# Patient Record
Sex: Male | Born: 1955 | Race: Black or African American | Hispanic: No | Marital: Single | Smoking: Light tobacco smoker
Health system: Southern US, Community
[De-identification: ages and names within clinical notes are randomized; demographics above are authoritative.]

## PROBLEM LIST (undated history)

## (undated) DIAGNOSIS — B1920 Unspecified viral hepatitis C without hepatic coma: Secondary | ICD-10-CM

## (undated) DIAGNOSIS — M109 Gout, unspecified: Secondary | ICD-10-CM

## (undated) DIAGNOSIS — G5600 Carpal tunnel syndrome, unspecified upper limb: Secondary | ICD-10-CM

## (undated) DIAGNOSIS — C801 Malignant (primary) neoplasm, unspecified: Secondary | ICD-10-CM

## (undated) DIAGNOSIS — K8020 Calculus of gallbladder without cholecystitis without obstruction: Secondary | ICD-10-CM

## (undated) DIAGNOSIS — I10 Essential (primary) hypertension: Secondary | ICD-10-CM

## (undated) HISTORY — PX: NO PAST SURGERIES: SHX2092

---

## 2006-05-22 ENCOUNTER — Emergency Department: Payer: Self-pay | Admitting: Internal Medicine

## 2007-08-21 ENCOUNTER — Emergency Department: Payer: Self-pay | Admitting: Emergency Medicine

## 2008-07-20 ENCOUNTER — Emergency Department: Payer: Self-pay | Admitting: Emergency Medicine

## 2008-08-14 ENCOUNTER — Emergency Department: Payer: Self-pay | Admitting: Emergency Medicine

## 2008-10-29 ENCOUNTER — Emergency Department: Payer: Self-pay | Admitting: Emergency Medicine

## 2010-03-19 ENCOUNTER — Ambulatory Visit: Payer: Self-pay

## 2010-07-26 ENCOUNTER — Emergency Department: Payer: Self-pay | Admitting: Unknown Physician Specialty

## 2012-02-17 ENCOUNTER — Emergency Department: Payer: Self-pay

## 2012-02-19 ENCOUNTER — Emergency Department: Payer: Self-pay | Admitting: Emergency Medicine

## 2014-05-02 ENCOUNTER — Emergency Department: Payer: Self-pay | Admitting: Emergency Medicine

## 2014-05-02 LAB — CBC
HCT: 42.3 % (ref 40.0–52.0)
HGB: 13.6 g/dL (ref 13.0–18.0)
MCH: 28.4 pg (ref 26.0–34.0)
MCHC: 32.1 g/dL (ref 32.0–36.0)
MCV: 89 fL (ref 80–100)
PLATELETS: 197 10*3/uL (ref 150–440)
RBC: 4.78 10*6/uL (ref 4.40–5.90)
RDW: 14.5 % (ref 11.5–14.5)
WBC: 7.9 10*3/uL (ref 3.8–10.6)

## 2014-05-02 LAB — URINALYSIS, COMPLETE
Bacteria: NONE SEEN
Bilirubin,UR: NEGATIVE
Blood: NEGATIVE
Glucose,UR: NEGATIVE mg/dL (ref 0–75)
KETONE: NEGATIVE
NITRITE: NEGATIVE
PH: 5 (ref 4.5–8.0)
PROTEIN: NEGATIVE
Specific Gravity: 1.005 (ref 1.003–1.030)
Squamous Epithelial: NONE SEEN

## 2014-05-02 LAB — COMPREHENSIVE METABOLIC PANEL
ALK PHOS: 131 U/L — AB
ANION GAP: 8 (ref 7–16)
AST: 149 U/L — AB (ref 15–37)
Albumin: 3.3 g/dL — ABNORMAL LOW (ref 3.4–5.0)
BILIRUBIN TOTAL: 0.5 mg/dL (ref 0.2–1.0)
BUN: 10 mg/dL (ref 7–18)
CALCIUM: 8.5 mg/dL (ref 8.5–10.1)
CREATININE: 1 mg/dL (ref 0.60–1.30)
Chloride: 106 mmol/L (ref 98–107)
Co2: 24 mmol/L (ref 21–32)
EGFR (Non-African Amer.): >60
Glucose: 99 mg/dL (ref 65–99)
OSMOLALITY: 275 (ref 275–301)
Potassium: 3.7 mmol/L (ref 3.5–5.1)
SGPT (ALT): 132 U/L — ABNORMAL HIGH (ref 12–78)
Sodium: 138 mmol/L (ref 136–145)
Total Protein: 8.6 g/dL — ABNORMAL HIGH (ref 6.4–8.2)

## 2014-05-02 LAB — CK TOTAL AND CKMB (NOT AT ARMC)
CK, Total: 154 U/L
CK-MB: 0.7 ng/mL (ref 0.5–3.6)

## 2014-05-02 LAB — TROPONIN I
Troponin-I: <0.02 ng/mL
Troponin-I: <0.02 ng/mL

## 2014-07-10 ENCOUNTER — Emergency Department: Payer: Self-pay | Admitting: Emergency Medicine

## 2014-07-10 LAB — BASIC METABOLIC PANEL
Anion Gap: 7 (ref 7–16)
BUN: 11 mg/dL (ref 7–18)
Calcium, Total: 8.8 mg/dL (ref 8.5–10.1)
Chloride: 106 mmol/L (ref 98–107)
Co2: 26 mmol/L (ref 21–32)
Creatinine: 1.21 mg/dL (ref 0.60–1.30)
EGFR (African American): >60
GLUCOSE: 111 mg/dL — AB (ref 65–99)
Osmolality: 278 (ref 275–301)
Potassium: 3.4 mmol/L — ABNORMAL LOW (ref 3.5–5.1)
SODIUM: 139 mmol/L (ref 136–145)

## 2014-07-10 LAB — CBC
HCT: 43.1 % (ref 40.0–52.0)
HGB: 13.7 g/dL (ref 13.0–18.0)
MCH: 29.3 pg (ref 26.0–34.0)
MCHC: 31.8 g/dL — AB (ref 32.0–36.0)
MCV: 92 fL (ref 80–100)
Platelet: 196 10*3/uL (ref 150–440)
RBC: 4.68 10*6/uL (ref 4.40–5.90)
RDW: 14.4 % (ref 11.5–14.5)
WBC: 6.2 10*3/uL (ref 3.8–10.6)

## 2014-07-10 LAB — PRO B NATRIURETIC PEPTIDE: B-TYPE NATIURETIC PEPTID: 111 pg/mL (ref 0–125)

## 2014-07-10 LAB — TROPONIN I

## 2014-09-25 ENCOUNTER — Emergency Department: Payer: Self-pay | Admitting: Emergency Medicine

## 2015-03-16 ENCOUNTER — Emergency Department: Payer: Self-pay | Admitting: Emergency Medicine

## 2015-03-16 LAB — BASIC METABOLIC PANEL
ANION GAP: 4 — AB (ref 7–16)
BUN: 14 mg/dL
CALCIUM: 8.8 mg/dL — AB
CO2: 29 mmol/L
Chloride: 105 mmol/L
Creatinine: 1.02 mg/dL
EGFR (African American): >60
GLUCOSE: 109 mg/dL — AB
Potassium: 4.5 mmol/L
Sodium: 138 mmol/L

## 2015-03-16 LAB — CBC WITH DIFFERENTIAL/PLATELET
BASOS ABS: 0 10*3/uL (ref 0.0–0.1)
Basophil %: 0.6 %
Eosinophil #: 0.1 10*3/uL (ref 0.0–0.7)
Eosinophil %: 2.2 %
HCT: 43.1 % (ref 40.0–52.0)
HGB: 13.8 g/dL (ref 13.0–18.0)
LYMPHS ABS: 1.8 10*3/uL (ref 1.0–3.6)
LYMPHS PCT: 40.5 %
MCH: 28.9 pg (ref 26.0–34.0)
MCHC: 32.1 g/dL (ref 32.0–36.0)
MCV: 90 fL (ref 80–100)
MONOS PCT: 17.9 %
Monocyte #: 0.8 x10 3/mm (ref 0.2–1.0)
Neutrophil #: 1.7 10*3/uL (ref 1.4–6.5)
Neutrophil %: 38.8 %
PLATELETS: 172 10*3/uL (ref 150–440)
RBC: 4.79 10*6/uL (ref 4.40–5.90)
RDW: 14.5 % (ref 11.5–14.5)
WBC: 4.4 10*3/uL (ref 3.8–10.6)

## 2015-03-16 LAB — TROPONIN I: Troponin-I: <0.03 ng/mL

## 2015-03-23 ENCOUNTER — Emergency Department
Admit: 2015-03-23 | Disposition: A | Discharge status: Home / Self Care | Payer: Self-pay | Admitting: Emergency Medicine

## 2015-03-23 LAB — CBC
HCT: 43.3 % (ref 40.0–52.0)
HGB: 14.4 g/dL (ref 13.0–18.0)
MCH: 29.6 pg (ref 26.0–34.0)
MCHC: 33.4 g/dL (ref 32.0–36.0)
MCV: 89 fL (ref 80–100)
Platelet: 170 10*3/uL (ref 150–440)
RBC: 4.88 10*6/uL (ref 4.40–5.90)
RDW: 14.2 % (ref 11.5–14.5)
WBC: 7.1 10*3/uL (ref 3.8–10.6)

## 2015-03-23 LAB — TROPONIN I

## 2015-03-23 LAB — BASIC METABOLIC PANEL
Anion Gap: 8 (ref 7–16)
BUN: 15 mg/dL
CREATININE: 1.05 mg/dL
Calcium, Total: 9.1 mg/dL
Chloride: 97 mmol/L — ABNORMAL LOW
Co2: 27 mmol/L
EGFR (African American): >60
EGFR (Non-African Amer.): >60
GLUCOSE: 121 mg/dL — AB
POTASSIUM: 3.3 mmol/L — AB
SODIUM: 132 mmol/L — AB

## 2017-01-10 ENCOUNTER — Emergency Department: Payer: Self-pay

## 2017-01-10 ENCOUNTER — Emergency Department
Admission: EM | Admit: 2017-01-10 | Discharge: 2017-01-10 | Disposition: A | Discharge status: Home / Self Care | Payer: Self-pay | Attending: Student in an Organized Health Care Education/Training Program | Admitting: Student in an Organized Health Care Education/Training Program

## 2017-01-10 ENCOUNTER — Encounter: Payer: Self-pay | Admitting: Emergency Medicine

## 2017-01-10 DIAGNOSIS — M109 Gout, unspecified: Secondary | ICD-10-CM

## 2017-01-10 DIAGNOSIS — F17200 Nicotine dependence, unspecified, uncomplicated: Secondary | ICD-10-CM | POA: Insufficient documentation

## 2017-01-10 DIAGNOSIS — I10 Essential (primary) hypertension: Secondary | ICD-10-CM | POA: Insufficient documentation

## 2017-01-10 DIAGNOSIS — M25532 Pain in left wrist: Secondary | ICD-10-CM

## 2017-01-10 DIAGNOSIS — M1009 Idiopathic gout, multiple sites: Secondary | ICD-10-CM | POA: Insufficient documentation

## 2017-01-10 HISTORY — DX: Essential (primary) hypertension: I10

## 2017-01-10 LAB — BASIC METABOLIC PANEL
ANION GAP: 6 (ref 5–15)
BUN: 16 mg/dL (ref 6–20)
CHLORIDE: 108 mmol/L (ref 101–111)
CO2: 27 mmol/L (ref 22–32)
Calcium: 9 mg/dL (ref 8.9–10.3)
Creatinine, Ser: 0.95 mg/dL (ref 0.61–1.24)
GFR calc non Af Amer: >60 mL/min (ref >60)
Glucose, Bld: 104 mg/dL — ABNORMAL HIGH (ref 65–99)
Potassium: 3.9 mmol/L (ref 3.5–5.1)
Sodium: 141 mmol/L (ref 135–145)

## 2017-01-10 LAB — CBC
HEMATOCRIT: 41.2 % (ref 40.0–52.0)
HEMOGLOBIN: 13.5 g/dL (ref 13.0–18.0)
MCH: 29.5 pg (ref 26.0–34.0)
MCHC: 32.8 g/dL (ref 32.0–36.0)
MCV: 89.9 fL (ref 80.0–100.0)
Platelets: 171 10*3/uL (ref 150–440)
RBC: 4.59 MIL/uL (ref 4.40–5.90)
RDW: 12.8 % (ref 11.5–14.5)
WBC: 5.9 10*3/uL (ref 3.8–10.6)

## 2017-01-10 LAB — TROPONIN I: Troponin I: <0.03 ng/mL (ref <0.03)

## 2017-01-10 LAB — URIC ACID: Uric Acid, Serum: 11.1 mg/dL — ABNORMAL HIGH (ref 4.4–7.6)

## 2017-01-10 MED ORDER — OXYCODONE-ACETAMINOPHEN 5-325 MG PO TABS
2.0000 | ORAL_TABLET | Freq: Once | ORAL | Status: AC
  Administered 2017-01-10: 2 via ORAL
  Filled 2017-01-10: qty 2

## 2017-01-10 MED ORDER — NAPROXEN 500 MG PO TABS: 500.0000 mg | ORAL_TABLET | Freq: Two times a day (BID) | ORAL | 0 refills | Status: DC

## 2017-01-10 MED ORDER — HYDROCODONE-ACETAMINOPHEN 5-325 MG PO TABS: 1.0000 | ORAL_TABLET | ORAL | 0 refills | Status: DC | PRN

## 2017-01-10 MED ORDER — PREDNISONE 20 MG PO TABS
60.0000 mg | ORAL_TABLET | Freq: Once | ORAL | Status: AC
  Administered 2017-01-10: 60 mg via ORAL
  Filled 2017-01-10: qty 3

## 2017-01-10 MED ORDER — NAPROXEN 375 MG PO TABS
375.0000 mg | ORAL_TABLET | Freq: Two times a day (BID) | ORAL | 0 refills | Status: AC
Start: 2017-01-10 — End: 2017-01-20

## 2017-01-10 NOTE — ED Triage Notes (Addendum)
Pt c/o pain to left chest, arm, and wrist on and off for week. Denies injury. Denies any other symptoms than pain. Not worse with exertion. Pt shows no acute distress. Also c/o numbness at times in left arm

## 2017-01-10 NOTE — Discharge Instructions (Signed)
Return to ER for any increase in pain, if the pain changes or becomes worse with physical activity, you have shortness of breath, nausea or vomiting associated with the chest pain. ° °

## 2017-01-10 NOTE — ED Provider Notes (Signed)
[facility] Emergency Department Provider Note    First MD Initiated Contact with Patient 01/10/17 [phone]     (approximate)  I have reviewed the triage vital signs and the nursing notes.   HISTORY  Chief Complaint Chest Pain    HPI Tim Potter is a 61 y.o. male with a history of hypertension as well as gout presents with intermittent left wrist pain for the past 10 days. States that the pain started in his left wrist with radiation up his arm and to his left shoulder. Patient is having some chest discomfort secondary to left wrist pain. States that when he has alcohol to drink or red meats the night before that awakened the next morning with worsening of his wrist pain. States it feels similar to his gout pain from his toe but he does not recall having gout in his left wrist. Denies any trauma. No shortness of breath. No diaphoresis or dizziness. No nausea or vomiting.   Past Medical History:  Diagnosis Date  . Hypertension    History reviewed. No pertinent family history. History reviewed. No pertinent surgical history. There are no active problems to display for this patient.     Prior to Admission medications   Medication Sig Start Date End Date Taking? Authorizing Provider  HYDROcodone-acetaminophen (NORCO) 5-325 MG tablet Take 1 tablet by mouth every 4 (four) hours as needed for moderate pain. 01/10/17   Merlyn Lot, MD  naproxen (NAPROSYN) 375 MG tablet Take 1 tablet (375 mg total) by mouth 2 (two) times daily with a meal. 01/10/17 01/20/17  Merlyn Lot, MD  naproxen (NAPROSYN) 500 MG tablet Take 1 tablet (500 mg total) by mouth 2 (two) times daily with a meal. 01/10/17 01/10/18  Merlyn Lot, MD    Allergies Patient has no known allergies.    Social History Social History  Substance Use Topics  . Smoking status: Current Every Day Smoker  . Smokeless tobacco: Never Used  . Alcohol use Yes    Review of Systems Patient  denies headaches, rhinorrhea, blurry vision, numbness, shortness of breath, chest pain, edema, cough, abdominal pain, nausea, vomiting, diarrhea, dysuria, fevers, rashes or hallucinations unless otherwise stated above in HPI. ____________________________________________   PHYSICAL EXAM:  VITAL SIGNS: Vitals:   01/10/17 0800  BP: (!) 160/107  Pulse: 72  Resp: 17  Temp: 97.8 F (36.6 C)    Constitutional: Alert and oriented. Well appearing and in no acute distress. Eyes: Conjunctivae are normal. PERRL. EOMI. Head: Atraumatic. Nose: No congestion/rhinnorhea. Mouth/Throat: Mucous membranes are moist.  Oropharynx non-erythematous. Neck: No stridor. Painless ROM. No cervical spine tenderness to palpation Hematological/Lymphatic/Immunilogical: No cervical lymphadenopathy. Cardiovascular: Normal rate, regular rhythm. Grossly normal heart sounds.  Good peripheral circulation. Respiratory: Normal respiratory effort.  No retractions. Lungs CTAB. Gastrointestinal: Soft and nontender. No distention. No abdominal bruits. No CVA tenderness. Musculoskeletal: No lower extremity tenderness nor edema.  No joint effusions.  Left wrist with pian with active and passive rom beyon 20 degrees.  No overlying warmth or erythema.  No deformity.   Neurologic:  Normal speech and language. No gross focal neurologic deficits are appreciated. No gait instability. Skin:  Skin is warm, dry and intact. No rash noted. Psychiatric: Mood and affect are normal. Speech and behavior are normal.  ____________________________________________   LABS (all labs ordered are listed, but only abnormal results are displayed)  Results for orders placed or performed during the hospital encounter of 01/10/17 (from the past 24 hour(s))  Basic  metabolic panel     Status: Abnormal   Collection Time: 01/10/17  8:21 AM  Result Value Ref Range   Sodium 141 135 - 145 mmol/L   Potassium 3.9 3.5 - 5.1 mmol/L   Chloride 108 101 - 111  mmol/L   CO2 27 22 - 32 mmol/L   Glucose, Bld 104 (H) 65 - 99 mg/dL   BUN 16 6 - 20 mg/dL   Creatinine, Ser 0.95 0.61 - 1.24 mg/dL   Calcium 9.0 8.9 - 10.3 mg/dL   GFR calc non Af Amer >60 >60 mL/min   GFR calc Af Amer >60 >60 mL/min   Anion gap 6 5 - 15  CBC     Status: None   Collection Time: 01/10/17  8:21 AM  Result Value Ref Range   WBC 5.9 3.8 - 10.6 K/uL   RBC 4.59 4.40 - 5.90 MIL/uL   Hemoglobin 13.5 13.0 - 18.0 g/dL   HCT 41.2 40.0 - 52.0 %   MCV 89.9 80.0 - 100.0 fL   MCH 29.5 26.0 - 34.0 pg   MCHC 32.8 32.0 - 36.0 g/dL   RDW 12.8 11.5 - 14.5 %   Platelets 171 150 - 440 K/uL  Troponin I     Status: None   Collection Time: 01/10/17  8:21 AM  Result Value Ref Range   Troponin I <0.03 <0.03 ng/mL  Uric acid     Status: Abnormal   Collection Time: 01/10/17  9:43 AM  Result Value Ref Range   Uric Acid, Serum 11.1 (H) 4.4 - 7.6 mg/dL   ____________________________________________  EKG My review and personal interpretation at Time: 8:13   Indication: chest pain  Rate: 70  Rhythm: sinus Axis: normal Other: no STEMI, normal intervals,  ____________________________________________  RADIOLOGY  I personally reviewed all radiographic images ordered to evaluate for the above acute complaints and reviewed radiology reports and findings.  These findings were personally discussed with the patient.  Please see medical record for radiology report. ____________________________________________   PROCEDURES  Procedure(s) performed:  Procedures    Critical Care performed: no ____________________________________________   INITIAL IMPRESSION / ASSESSMENT AND PLAN / ED COURSE  Pertinent labs & imaging results that were available during my care of the patient were reviewed by me and considered in my medical decision making (see chart for details).  DDX: gout, septic arthritis, ra, oa, acs,   Tim Potter is a 61 y.o. who presents to the ED with left wrist pain over the  past week. Symptoms worsened by alcohol and red meat. Patient arrives in no acute distress. No neuro deficit. Well-perfused and equal pulses bilaterally. Not consistent with dissection. Troponin is negative and EKG shows no ischemic changes. Based on duration symptoms do not feel this is consistent with ACS more likely referred pain from acute arthritis to left wrist. Patient without any fever or overlying warmth or erythema. Feel that this is consistent with septic arthritis. We'll check uric acid, radiographs and provide pain medication.  Clinical Course as of Jan 10 1039  Sun Jan 10, 2017  1026 Uric acid markedly elevated. Presentation most consistent with guty arthritis.  Will provide steroids and NSAIDs.  Discussed signs and symptoms for which patient should return to the hospital.  Patient was able to tolerate PO and was able to ambulate with a steady gait.  Have discussed with the patient and available family all diagnostics and treatments performed thus far and all questions were answered to the best of  my ability. The patient demonstrates understanding and agreement with plan.   [PR]    Clinical Course User Index [PR] Merlyn Lot, MD     ____________________________________________   FINAL CLINICAL IMPRESSION(S) / ED DIAGNOSES  Final diagnoses:  Wrist pain, acute, left  Acute gout of multiple sites, unspecified cause      NEW MEDICATIONS STARTED DURING THIS VISIT:  New Prescriptions   HYDROCODONE-ACETAMINOPHEN (NORCO) 5-325 MG TABLET    Take 1 tablet by mouth every 4 (four) hours as needed for moderate pain.   NAPROXEN (NAPROSYN) 375 MG TABLET    Take 1 tablet (375 mg total) by mouth 2 (two) times daily with a meal.   NAPROXEN (NAPROSYN) 500 MG TABLET    Take 1 tablet (500 mg total) by mouth 2 (two) times daily with a meal.     Note:  This document was prepared using Dragon voice recognition software and may include unintentional dictation errors.    Merlyn Lot, MD 01/10/17 1040

## 2017-01-10 NOTE — ED Notes (Signed)
Pt c/o poss gout to left wrist, states swelling and painful x1wk. Denies fever

## 2017-05-30 ENCOUNTER — Emergency Department
Admission: EM | Admit: 2017-05-30 | Discharge: 2017-05-30 | Disposition: A | Discharge status: Home / Self Care | Payer: Self-pay | Attending: Emergency Medicine | Admitting: Emergency Medicine

## 2017-05-30 ENCOUNTER — Emergency Department: Payer: Self-pay

## 2017-05-30 ENCOUNTER — Encounter: Payer: Self-pay | Admitting: Emergency Medicine

## 2017-05-30 DIAGNOSIS — I10 Essential (primary) hypertension: Secondary | ICD-10-CM | POA: Insufficient documentation

## 2017-05-30 DIAGNOSIS — I8002 Phlebitis and thrombophlebitis of superficial vessels of left lower extremity: Secondary | ICD-10-CM | POA: Insufficient documentation

## 2017-05-30 DIAGNOSIS — F17200 Nicotine dependence, unspecified, uncomplicated: Secondary | ICD-10-CM | POA: Insufficient documentation

## 2017-05-30 NOTE — ED Notes (Signed)

## 2017-05-30 NOTE — ED Provider Notes (Signed)
[facility] Emergency Department Provider Note   ____________________________________________   I have reviewed the triage vital signs and the nursing notes.   HISTORY  Chief Complaint Leg Pain    HPI Tim Potter is a 61 y.o. male presents with left calf pain that began 2-3 weeks ago. Patient notes a hard palpable nodule along the medial calf approximately 3-4 mm in diameter. Patient denies left leg swelling, shortness of breath, recent period of sedentary activity, surgical procedure, recent flights, history of cancer or other risk factors for clotting.  Patient denies fever, chills, headache, vision changes, chest pain, chest tightness, shortness of breath, abdominal pain, nausea and vomiting.  Past Medical History:  Diagnosis Date  . Hypertension     There are no active problems to display for this patient.   History reviewed. No pertinent surgical history.  Prior to Admission medications   Medication Sig Start Date End Date Taking? Authorizing Provider  HYDROcodone-acetaminophen (NORCO) 5-325 MG tablet Take 1 tablet by mouth every 4 (four) hours as needed for moderate pain. 01/10/17   Merlyn Lot, MD  naproxen (NAPROSYN) 500 MG tablet Take 1 tablet (500 mg total) by mouth 2 (two) times daily with a meal. 01/10/17 01/10/18  Merlyn Lot, MD    Allergies Patient has no known allergies.  History reviewed. No pertinent family history.  Social History Social History  Substance Use Topics  . Smoking status: Current Every Day Smoker  . Smokeless tobacco: Never Used  . Alcohol use Yes    Review of Systems Constitutional: Negative for fever/chills Eyes: No visual changes. ENT:  Negative for sore throat and for difficulty swallowing Cardiovascular: Denies chest pain. Respiratory: Denies cough Denies shortness of breath. Musculoskeletal: Left calf pain, painful to touch. Skin: Negative for rash. Neurological: Negative for  headaches.  Negative focal weakness or numbness. Negative for loss of consciousness. Able to ambulate. ____________________________________________   PHYSICAL EXAM:  VITAL SIGNS: ED Triage Vitals [05/30/17 1123]  Enc Vitals Group     BP (!) 172/86     Pulse Rate 100     Resp 16     Temp 98.3 F (36.8 C)     Temp Source Oral     SpO2 96 %     Weight 180 lb (81.6 kg)     Height 5\' 10"  (1.778 m)     Head Circumference      Peak Flow      Pain Score 6     Pain Loc      Pain Edu?      Excl. in [place]?     Constitutional: Alert and oriented. Well appearing and in no acute distress.  Head: Normocephalic and atraumatic. Eyes: Conjunctivae are normal. PERRL.  Cardiovascular: Normal rate, regular rhythm. Normal distal pulses. Respiratory: Normal respiratory effort. No wheezes/rales/rhonchi. Lungs CTAB Musculoskeletal: Nontender with normal range of motion in all extremities. Left medial calf tender to palpation along firm papule consistent with US findings of superficial thrombophlebitis. Left lower extremity strength and sensation intact.  Neurologic: Normal speech and language.  Skin:  Skin is warm, dry and intact. No rash noted. Psychiatric: Mood and affect are normal.  ____________________________________________   LABS (all labs ordered are listed, but only abnormal results are displayed)  Labs Reviewed - No data to display ____________________________________________  EKG None ____________________________________________  RADIOLOGY Ultrasound venous lower leg left IMPRESSION: No evidence of DVT within the left lower extremity.  Small thrombus in a small superficial vein in  the lower leg is compatible with superficial thrombophlebitis. ____________________________________________   PROCEDURES  Procedure(s) performed: No    Critical Care performed: no ____________________________________________   INITIAL IMPRESSION / ASSESSMENT AND PLAN / ED  COURSE  Pertinent labs & imaging results that were available during my care of the patient were reviewed by me and considered in my medical decision making (see chart for details).  Patient presents with left medial lower leg pain that began 2-3 weeks ago without traumatic injury. Physical exam, patient history and ultrasound venous of the left leg are reassuring of no DVT. Symptoms consistent with superficial vein thrombophlebitis along the area of tenderness and firm nodule. Recommended patient continue to monitor, take NSAIDs as needed, compression hose, and warm compresses. Patient  informed of clinical course, understand medical decision-making process, and agree with plan. Patient was advised to follow up with PCP as needed and was also advised to return to the emergency department for symptoms that change or worsen.      _____________________   FINAL CLINICAL IMPRESSION(S) / ED DIAGNOSES  Final diagnoses:  Thrombophlebitis of superficial veins of left lower extremity       NEW MEDICATIONS STARTED DURING THIS VISIT:  Discharge Medication List as of 05/30/2017  3:32 PM       Note:  This document was prepared using Dragon voice recognition software and may include unintentional dictation errors.    Arah Aro, Laroy Apple, PA-C 05/30/17 1808    Darel Hong, MD 05/31/17 [phone]

## 2017-05-30 NOTE — ED Triage Notes (Signed)
Pt c/o pain to left calf for 2-3 weeks.  No swelling. Small hard palpable knot to left calf.  Painful to touch.

## 2017-07-22 ENCOUNTER — Emergency Department: Payer: Self-pay

## 2017-07-22 ENCOUNTER — Emergency Department
Admission: EM | Admit: 2017-07-22 | Discharge: 2017-07-22 | Disposition: A | Discharge status: Home / Self Care | Payer: Self-pay | Attending: Emergency Medicine | Admitting: Emergency Medicine

## 2017-07-22 ENCOUNTER — Encounter: Payer: Self-pay | Admitting: Emergency Medicine

## 2017-07-22 DIAGNOSIS — K769 Liver disease, unspecified: Secondary | ICD-10-CM | POA: Insufficient documentation

## 2017-07-22 DIAGNOSIS — K8020 Calculus of gallbladder without cholecystitis without obstruction: Secondary | ICD-10-CM | POA: Insufficient documentation

## 2017-07-22 DIAGNOSIS — R1011 Right upper quadrant pain: Secondary | ICD-10-CM | POA: Insufficient documentation

## 2017-07-22 DIAGNOSIS — F17200 Nicotine dependence, unspecified, uncomplicated: Secondary | ICD-10-CM | POA: Insufficient documentation

## 2017-07-22 DIAGNOSIS — I10 Essential (primary) hypertension: Secondary | ICD-10-CM | POA: Insufficient documentation

## 2017-07-22 DIAGNOSIS — Z7982 Long term (current) use of aspirin: Secondary | ICD-10-CM | POA: Insufficient documentation

## 2017-07-22 LAB — CBC
HEMATOCRIT: 42.7 % (ref 40.0–52.0)
HEMOGLOBIN: 14.6 g/dL (ref 13.0–18.0)
MCH: 30.4 pg (ref 26.0–34.0)
MCHC: 34.2 g/dL (ref 32.0–36.0)
MCV: 89 fL (ref 80.0–100.0)
Platelets: 136 10*3/uL — ABNORMAL LOW (ref 150–440)
RBC: 4.8 MIL/uL (ref 4.40–5.90)
RDW: 14.1 % (ref 11.5–14.5)
WBC: 7 10*3/uL (ref 3.8–10.6)

## 2017-07-22 LAB — COMPREHENSIVE METABOLIC PANEL
ALK PHOS: 134 U/L — AB (ref 38–126)
ALT: 107 U/L — AB (ref 17–63)
ANION GAP: 9 (ref 5–15)
AST: 161 U/L — ABNORMAL HIGH (ref 15–41)
Albumin: 3.8 g/dL (ref 3.5–5.0)
BILIRUBIN TOTAL: 1.7 mg/dL — AB (ref 0.3–1.2)
BUN: 12 mg/dL (ref 6–20)
CO2: 24 mmol/L (ref 22–32)
CREATININE: 1.01 mg/dL (ref 0.61–1.24)
Calcium: 9.1 mg/dL (ref 8.9–10.3)
Chloride: 104 mmol/L (ref 101–111)
GFR calc non Af Amer: >60 mL/min (ref >60)
Glucose, Bld: 124 mg/dL — ABNORMAL HIGH (ref 65–99)
Potassium: 3.5 mmol/L (ref 3.5–5.1)
SODIUM: 137 mmol/L (ref 135–145)
TOTAL PROTEIN: 8.6 g/dL — AB (ref 6.5–8.1)

## 2017-07-22 LAB — TROPONIN I

## 2017-07-22 LAB — LIPASE, BLOOD: LIPASE: 32 U/L (ref 11–51)

## 2017-07-22 MED ORDER — HYDROCHLOROTHIAZIDE 12.5 MG PO TABS: 12.5000 mg | ORAL_TABLET | Freq: Every day | ORAL | 1 refills | Status: DC

## 2017-07-22 NOTE — ED Provider Notes (Signed)
[facility] Emergency Department Provider Note  ____________________________________________  Time seen: Approximately 7:45 AM  I have reviewed the triage vital signs and the nursing notes.   HISTORY  Chief Complaint Abdominal Pain; Back Pain; and Chest Pain   HPI Tim Potter is a 61 y.o. male with a history of hypertensionwho presents for evaluation of 2 complaints, chest pain and abdominal pain. Patient reports that these symptoms have been going on for 3 days. They're separate from each other and intermittent. The chest pain describes as a dull-like sensation located in the center of his chest. He has had 1 episode a day for the last 3 days lasting about 30 minutes. Patient unable to remember if he  Ever had pain at rest. He reports that he has to carry heavy things at work and that usually when the pain develops. No diaphoresis, nausea, shortness of breath, palpitations, dizziness. He is a smoker. He has been out of his antihypertensives for several months. He does smoke. No family history of ischemic heart disease. Last episode of pain was yesterday. No pain at this time. He is also complaining of abdominal pain that is located in the center of his abdomen, dull, intermittent, mild to moderate, few episodes daily lasting 30 min at a time. Pain has been on and off for 3 days. Again no pain at this time. Pain not exacerbated by food. No nausea or vomiting, no dysuria hematuria, no constipation or diarrhea, no fever or chills. Patient reports that he came in today just to make sure everything is okay. He drinks one 40 oz beer 4 times a week. CP and abdominal pain happen at separate times.  Past Medical History:  Diagnosis Date  . Hypertension     There are no active problems to display for this patient.   History reviewed. No pertinent surgical history.  Prior to Admission medications   Medication Sig Start Date End Date Taking? Authorizing Provider    aspirin EC 81 MG tablet Take 81 mg by mouth daily.   Yes [provider]  HYDROcodone-acetaminophen (NORCO) 5-325 MG tablet Take 1 tablet by mouth every 4 (four) hours as needed for moderate pain. Patient not taking: Reported on 07/22/2017 01/10/17   Merlyn Lot, MD  naproxen (NAPROSYN) 500 MG tablet Take 1 tablet (500 mg total) by mouth 2 (two) times daily with a meal. Patient not taking: Reported on 07/22/2017 01/10/17 01/10/18  Merlyn Lot, MD    Allergies Patient has no known allergies.  No family history on file.  Social History Social History  Substance Use Topics  . Smoking status: Current Every Day Smoker  . Smokeless tobacco: Never Used  . Alcohol use Yes    Review of Systems  Constitutional: Negative for fever. Eyes: Negative for visual changes. ENT: Negative for sore throat. Neck: No neck pain  Cardiovascular: + chest pain. Respiratory: No shortness of breath. Gastrointestinal: + abdominal pain. No vomiting or diarrhea. Genitourinary: Negative for dysuria. Musculoskeletal: Negative for back pain. Skin: Negative for rash. Neurological: Negative for headaches, weakness or numbness. Psych: No SI or HI  ____________________________________________   PHYSICAL EXAM:  VITAL SIGNS: ED Triage Vitals [07/22/17 0655]  Enc Vitals Group     BP (!) 170/100     Pulse Rate 79     Resp 18     Temp 98 F (36.7 C)     Temp Source Oral     SpO2 99 %     Weight 175  lb (79.4 kg)     Height 5\' 10"  (1.778 m)     Head Circumference      Peak Flow      Pain Score 6     Pain Loc      Pain Edu?      Excl. in [place]?     Constitutional: Alert and oriented. Well appearing and in no apparent distress. HEENT:      Head: Normocephalic and atraumatic.         Eyes: Conjunctivae are normal. Sclera is non-icteric.       Mouth/Throat: Mucous membranes are moist.       Neck: Supple with no signs of meningismus. Cardiovascular: Regular rate and rhythm. No murmurs,  gallops, or rubs. 2+ symmetrical distal pulses are present in all extremities. No JVD. Respiratory: Normal respiratory effort. Lungs are clear to auscultation bilaterally. No wheezes, crackles, or rhonchi.  Gastrointestinal: Soft, Tenderness to palpation in the right upper quadrant with negative Murphy sign, and non distended with positive bowel sounds. No rebound or guarding. Musculoskeletal: Nontender with normal range of motion in all extremities. No edema, cyanosis, or erythema of extremities. Neurologic: Normal speech and language. Face is symmetric. Moving all extremities. No gross focal neurologic deficits are appreciated. Skin: Skin is warm, dry and intact. No rash noted. Psychiatric: Mood and affect are normal. Speech and behavior are normal.  ____________________________________________   LABS (all labs ordered are listed, but only abnormal results are displayed)  Labs Reviewed  COMPREHENSIVE METABOLIC PANEL - Abnormal; Notable for the following:       Result Value   Glucose, Bld 124 (*)    Total Protein 8.6 (*)    AST 161 (*)    ALT 107 (*)    Alkaline Phosphatase 134 (*)    Total Bilirubin 1.7 (*)    All other components within normal limits  CBC - Abnormal; Notable for the following:    Platelets 136 (*)    All other components within normal limits  LIPASE, BLOOD  TROPONIN I   ____________________________________________  EKG  ED ECG REPORT I, Rudene Re, the attending physician, personally viewed and interpreted this ECG.  Normal sinus rhythm, rate of 81, normal intervals, normal axis, no ST elevations or depressions. Normal EKG.  ____________________________________________  RADIOLOGY  CXR: There is no acute cardiopulmonary abnormality. 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  RUQ [place]; 1. Multiple gallstones. No evidence of cholecystitis or biliary distention.  2. Heterogeneous parenchymal pattern consistent with fatty infiltration  and/or hepatocellular disease. 2.7 cm maximum diameter focal area of decreased attenuation noted in the right hepatic lobe. Although this may represent focal fatty sparing, focal significant hepatic lesion cannot be excluded. Gadolinium-enhanced MRI can be obtained to further evaluate. ____________________________________________   PROCEDURES  Procedure(s) performed: None Procedures Critical Care performed:  None ____________________________________________   INITIAL IMPRESSION / ASSESSMENT AND PLAN / ED COURSE   61 y.o. male with a history of hypertensionwho presents for evaluation of 2 complaints, chest pain and abdominal pain.   #CP: episodes while lifting objects. No pain today. Last episode > 6 hours PTA. Chest pain in a 61 y.o. male with low suspicion for cardiac (HEART score 3) or other serious etiology (including aortic dissection, pneumonia, pneumothorax, or pulmonary embolism) based his history and physical exam in the ED today. EKG normal. Plan for labs including CBC, chemistries and troponin x 1 since last episode of pain > 12 hours, CXR and re-evaluation for disposition. Will give  full dose ASA . Will observe patient on cardiac monitor while in the ED.  # abdominal pain: Patient denies any pain however with palpation of his abdomen he has tenderness in the right upper quadrant with negative Murphy sign. Since patient has no pain at this time will check CBC, CMP and lipase and if labs are within normal limits and patient remains pain-free we'll discharge him home with follow-up with primary care doctor for possible gallbladder disease. No RLQ ttp however I did discuss with patient that if pain migrates to the right lower quadrant he needs immediate evaluation for possible appendicitis.  _________________________ 10:15 AM on 07/22/2017 -----------------------------------------  Right upper quadrant ultrasound and labs concerning for cholelithiasis without evidence of  cholecystitis. Patient remains pain-free. Ultrasound concerning for changes in the liver parenchyma. Discussed alcohol use with patient. Recommended follow-up for an outpatient MRI to rule out malignancy. Patient given oncology follow-up. Also given resources to apply for charity care. Patient was restarted on his blood pressure medication. Recommended follow-up with the surgery for evaluation of cholelithiasis. Recommended return to the emergency room if the pain returns and remains constant, he has a fever. Patient will return.    Pertinent labs & imaging results that were available during my care of the patient were reviewed by me and considered in my medical decision making (see chart for details).    ____________________________________________   FINAL CLINICAL IMPRESSION(S) / ED DIAGNOSES  Final diagnoses:  RUQ abdominal pain  Calculus of gallbladder without cholecystitis without obstruction  Liver disease      NEW MEDICATIONS STARTED DURING THIS VISIT:  New Prescriptions   No medications on file     Note:  This document was prepared using Dragon voice recognition software and may include unintentional dictation errors.    Alfred Levins, [place], MD 07/22/17 1031

## 2017-07-22 NOTE — ED Triage Notes (Signed)
Patient ambulatory to triage with steady gait, without difficulty or distress noted; pt reports lower back pain, lower abd pain, mid CP radiating into back last several days; denies hx of same; denies any accomp symptoms

## 2017-07-22 NOTE — ED Notes (Signed)
Patient transported to US at this time.  

## 2017-07-22 NOTE — Discharge Instructions (Signed)
As I explained to you you have gallstones in her gallbladder. I am referring you to Dr. Hampton Abbot for further evaluation. If you're abdominal pain returns and remains persistent for over an hour or if you have fevers please come back to the emergency room as he may need an emergency surgery to remove her gallbladder.  Also has explained to you your liver looks abnormal ultrasound which could be due to drinking or fatty liver disease. However radiology recommended a day you follow-up for an outpatient MRI to rule out liver cancer. I am referring her to Dr. Janese Banks for close follow up. Oncology clinic will see you for a first visit without insurance. It is very important that you follow up otherwise if you do have cancer in your liver, it can spread to other organs and it may be too late for any treatment.  You have also been provided with information on how to apply for charity care [facility] which can cover your medical expenses. Make sure they start working on that as soon as possible.

## 2017-07-22 NOTE — Care Management Note (Signed)
Case Management Note  Patient Details  Name: Tim Potter MRN: [id number] Date of Birth: [birth year]  Subjective/Objective:     Was able to print application form for [facility] after pt. Left. I have put the application in the mail for the patient at the listed address.               Action/Plan:   Expected Discharge Date:                  Expected Discharge Plan:     In-House Referral:     Discharge planning Services     Post Acute Care Choice:    Choice offered to:     DME Arranged:    DME Agency:     HH Arranged:    HH Agency:     Status of Service:     If discussed at [employer] of Stay Meetings, dates discussed:    Additional Comments:  Beau Fanny, RN 07/22/2017, 1:42 PM

## 2017-07-28 ENCOUNTER — Telehealth: Payer: Self-pay | Admitting: Surgery

## 2017-07-28 NOTE — Telephone Encounter (Signed)
I have called patient to make an appointment for ED Follow-up (8/2): Cholelithiasis. No answer. I have left a message on voicemail. Patient can see any available surgeon.

## 2017-08-04 NOTE — Telephone Encounter (Signed)
I have contacted patient to make an ED follow up appointment. Patient states that he does not have insurance at this time and can not make a co-pay of 50.00. Patient states that he will call back and make an appointment at another time when he is financially able.

## 2017-11-07 ENCOUNTER — Emergency Department: Payer: Medicaid Other

## 2017-11-07 ENCOUNTER — Encounter: Payer: Self-pay | Admitting: Emergency Medicine

## 2017-11-07 ENCOUNTER — Inpatient Hospital Stay
Admission: EM | Admit: 2017-11-07 | Discharge: 2017-11-09 | DRG: 437 | Disposition: A | Discharge status: Home / Self Care | Payer: Medicaid Other | Attending: Internal Medicine | Admitting: Internal Medicine

## 2017-11-07 ENCOUNTER — Other Ambulatory Visit: Payer: Self-pay

## 2017-11-07 DIAGNOSIS — Z791 Long term (current) use of non-steroidal anti-inflammatories (NSAID): Secondary | ICD-10-CM | POA: Diagnosis not present

## 2017-11-07 DIAGNOSIS — K30 Functional dyspepsia: Secondary | ICD-10-CM | POA: Diagnosis present

## 2017-11-07 DIAGNOSIS — I10 Essential (primary) hypertension: Secondary | ICD-10-CM | POA: Diagnosis present

## 2017-11-07 DIAGNOSIS — R160 Hepatomegaly, not elsewhere classified: Secondary | ICD-10-CM | POA: Diagnosis present

## 2017-11-07 DIAGNOSIS — Z79899 Other long term (current) drug therapy: Secondary | ICD-10-CM | POA: Diagnosis not present

## 2017-11-07 DIAGNOSIS — R198 Other specified symptoms and signs involving the digestive system and abdomen: Secondary | ICD-10-CM

## 2017-11-07 DIAGNOSIS — C220 Liver cell carcinoma: Principal | ICD-10-CM | POA: Diagnosis present

## 2017-11-07 DIAGNOSIS — I81 Portal vein thrombosis: Secondary | ICD-10-CM | POA: Diagnosis present

## 2017-11-07 DIAGNOSIS — Z823 Family history of stroke: Secondary | ICD-10-CM | POA: Diagnosis not present

## 2017-11-07 DIAGNOSIS — M109 Gout, unspecified: Secondary | ICD-10-CM | POA: Diagnosis present

## 2017-11-07 DIAGNOSIS — F17200 Nicotine dependence, unspecified, uncomplicated: Secondary | ICD-10-CM | POA: Diagnosis present

## 2017-11-07 DIAGNOSIS — Z7982 Long term (current) use of aspirin: Secondary | ICD-10-CM

## 2017-11-07 DIAGNOSIS — K769 Liver disease, unspecified: Secondary | ICD-10-CM

## 2017-11-07 DIAGNOSIS — R1011 Right upper quadrant pain: Secondary | ICD-10-CM

## 2017-11-07 HISTORY — DX: Carpal tunnel syndrome, unspecified upper limb: G56.00

## 2017-11-07 HISTORY — DX: Calculus of gallbladder without cholecystitis without obstruction: K80.20

## 2017-11-07 HISTORY — DX: Gout, unspecified: M10.9

## 2017-11-07 LAB — CBC
HCT: 44.3 % (ref 40.0–52.0)
HEMOGLOBIN: 14.7 g/dL (ref 13.0–18.0)
MCH: 31.3 pg (ref 26.0–34.0)
MCHC: 33.1 g/dL (ref 32.0–36.0)
MCV: 94.5 fL (ref 80.0–100.0)
Platelets: 138 10*3/uL — ABNORMAL LOW (ref 150–440)
RBC: 4.68 MIL/uL (ref 4.40–5.90)
RDW: 14.8 % — ABNORMAL HIGH (ref 11.5–14.5)
WBC: 8.9 10*3/uL (ref 3.8–10.6)

## 2017-11-07 LAB — COMPREHENSIVE METABOLIC PANEL
ALK PHOS: 348 U/L — AB (ref 38–126)
ALT: 54 U/L (ref 17–63)
ANION GAP: 7 (ref 5–15)
AST: 123 U/L — ABNORMAL HIGH (ref 15–41)
Albumin: 2.9 g/dL — ABNORMAL LOW (ref 3.5–5.0)
BILIRUBIN TOTAL: 4.6 mg/dL — AB (ref 0.3–1.2)
BUN: 10 mg/dL (ref 6–20)
CALCIUM: 9 mg/dL (ref 8.9–10.3)
CO2: 27 mmol/L (ref 22–32)
Chloride: 102 mmol/L (ref 101–111)
Creatinine, Ser: 0.87 mg/dL (ref 0.61–1.24)
GFR calc non Af Amer: >60 mL/min (ref >60)
Glucose, Bld: 109 mg/dL — ABNORMAL HIGH (ref 65–99)
Potassium: 3.9 mmol/L (ref 3.5–5.1)
SODIUM: 136 mmol/L (ref 135–145)
TOTAL PROTEIN: 9.2 g/dL — AB (ref 6.5–8.1)

## 2017-11-07 LAB — URINALYSIS, COMPLETE (UACMP) WITH MICROSCOPIC
Bacteria, UA: NONE SEEN
Specific Gravity, Urine: 1.026 (ref 1.005–1.030)

## 2017-11-07 LAB — APTT: aPTT: 36 seconds (ref 24–36)

## 2017-11-07 LAB — PROTIME-INR
INR: 1.22
Prothrombin Time: 15.3 seconds — ABNORMAL HIGH (ref 11.4–15.2)

## 2017-11-07 LAB — LIPASE, BLOOD: Lipase: 29 U/L (ref 11–51)

## 2017-11-07 MED ORDER — ONDANSETRON HCL 4 MG PO TABS: 4.0000 mg | ORAL_TABLET | Freq: Four times a day (QID) | ORAL | Status: DC | PRN

## 2017-11-07 MED ORDER — HYDROCHLOROTHIAZIDE 12.5 MG PO CAPS
ORAL_CAPSULE | ORAL | Status: AC
  Filled 2017-11-07: qty 1

## 2017-11-07 MED ORDER — ACETAMINOPHEN 650 MG RE SUPP
650.0000 mg | Freq: Four times a day (QID) | RECTAL | Status: DC | PRN
Start: 2017-11-07 — End: 2017-11-09

## 2017-11-07 MED ORDER — ONDANSETRON HCL 4 MG/2ML IJ SOLN: 4.0000 mg | Freq: Four times a day (QID) | INTRAMUSCULAR | Status: DC | PRN

## 2017-11-07 MED ORDER — SODIUM CHLORIDE 0.9% FLUSH: 3.0000 mL | INTRAVENOUS | Status: DC | PRN

## 2017-11-07 MED ORDER — HEPARIN BOLUS VIA INFUSION
4000.0000 [IU] | Freq: Once | INTRAVENOUS | Status: DC
  Filled 2017-11-07: qty 4000

## 2017-11-07 MED ORDER — IOPAMIDOL (ISOVUE-300) INJECTION 61%
100.0000 mL | Freq: Once | INTRAVENOUS | Status: AC | PRN
  Administered 2017-11-07: 100 mL via INTRAVENOUS

## 2017-11-07 MED ORDER — ONDANSETRON HCL 4 MG/2ML IJ SOLN
4.0000 mg | Freq: Once | INTRAMUSCULAR | Status: AC
  Administered 2017-11-07: 4 mg via INTRAVENOUS
  Filled 2017-11-07: qty 2

## 2017-11-07 MED ORDER — HYDROCHLOROTHIAZIDE 25 MG PO TABS
12.5000 mg | ORAL_TABLET | Freq: Every day | ORAL | Status: DC
  Administered 2017-11-07 – 2017-11-08 (×2): 12.5 mg via ORAL
  Filled 2017-11-07 (×2): qty 1

## 2017-11-07 MED ORDER — OXYCODONE HCL 5 MG PO TABS
5.0000 mg | ORAL_TABLET | ORAL | Status: DC | PRN
  Administered 2017-11-07 – 2017-11-09 (×3): 5 mg via ORAL
  Filled 2017-11-07 (×3): qty 1

## 2017-11-07 MED ORDER — HEPARIN (PORCINE) IN NACL 100-0.45 UNIT/ML-% IJ SOLN
1300.0000 [IU]/h | INTRAMUSCULAR | Status: DC
  Filled 2017-11-07 (×2): qty 250

## 2017-11-07 MED ORDER — MORPHINE SULFATE (PF) 2 MG/ML IV SOLN: 2.0000 mg | INTRAVENOUS | Status: DC | PRN

## 2017-11-07 MED ORDER — IOPAMIDOL (ISOVUE-300) INJECTION 61%
30.0000 mL | Freq: Once | INTRAVENOUS | Status: AC | PRN
  Administered 2017-11-07: 30 mL via ORAL

## 2017-11-07 MED ORDER — SODIUM CHLORIDE 0.9 % IV BOLUS (SEPSIS)
1000.0000 mL | Freq: Once | INTRAVENOUS | Status: AC
  Administered 2017-11-07: 1000 mL via INTRAVENOUS

## 2017-11-07 MED ORDER — SODIUM CHLORIDE 0.9% FLUSH
3.0000 mL | Freq: Two times a day (BID) | INTRAVENOUS | Status: DC
  Administered 2017-11-07 – 2017-11-09 (×4): 3 mL via INTRAVENOUS

## 2017-11-07 MED ORDER — ACETAMINOPHEN 325 MG PO TABS: 650.0000 mg | ORAL_TABLET | Freq: Four times a day (QID) | ORAL | Status: DC | PRN

## 2017-11-07 MED ORDER — MORPHINE SULFATE (PF) 4 MG/ML IV SOLN
4.0000 mg | Freq: Once | INTRAVENOUS | Status: AC
  Administered 2017-11-07: 4 mg via INTRAVENOUS
  Filled 2017-11-07: qty 1

## 2017-11-07 NOTE — Progress Notes (Signed)
Pt admitted from ED with RUQ abdominal pain 7/10 with oxycodone given. Currently resting quietly watching TV; no acute distress.

## 2017-11-07 NOTE — H&P (Signed)
[facility] [place] NAME: Tim Potter    MR#:  [id number]  DATE OF BIRTH:  05-12-1956  DATE OF ADMISSION:  11/07/2017  PRIMARY CARE PHYSICIAN: No PCP  REQUESTING/REFERRING PHYSICIAN: Dr Charlotte Crumb  CHIEF COMPLAINT:   Chief Complaint  Patient presents with  . Abdominal Pain    HISTORY OF PRESENT ILLNESS:  Tim Potter  is a 61 y.o. male with a known history of hypertension presents back with abdominal pain.  He was recently seen in the ER for abdominal pain and told he had gallstones.  He states his pain did not go away.  He describes it as sharp pain in his epigastric area 8 out of 10 in intensity.  Ibuprofen does not help.  Associated with nausea but no vomiting.  He is having some indigestion.  The pain is been going on for a few months.  Today in the ER he had an ultrasound of the abdomen that was concerning for liver mass.  A CT scan confirmed a large liver mass that could be invading hepatic vein and also concerning for portal vein thrombosis.  PAST MEDICAL HISTORY:   Past Medical History:  Diagnosis Date  . Carpal tunnel syndrome   . Gallstones   . Gout   . Hypertension     PAST SURGICAL HISTORY:   Past Surgical History:  Procedure Laterality Date  . NO PAST SURGERIES      SOCIAL HISTORY:   Social History   Tobacco Use  . Smoking status: Current Every Day Smoker  . Smokeless tobacco: Never Used  Substance Use Topics  . Alcohol use: Yes    Comment: 2-24 oz beer 4X per week    FAMILY HISTORY:   Family History  Problem Relation Age of Onset  . Cancer Mother   . CVA Father     DRUG ALLERGIES:  No Known Allergies  REVIEW OF SYSTEMS:  CONSTITUTIONAL: No fever, chills or sweats.  Positive for  fatigue.  Positive for to 5 pound weight gain EYES: No blurred or double vision.  Wears reading glasses EARS, NOSE, AND THROAT: No tinnitus or ear pain. No sore throat RESPIRATORY: No cough, occasional  shortness of breath, no wheezing or hemoptysis.  CARDIOVASCULAR: Occasional chest pain, no orthopnea, edema.  GASTROINTESTINAL: Positive for nausea, and abdominal pain. No blood in bowel movements GENITOURINARY: No dysuria, hematuria.  ENDOCRINE: No polyuria, nocturia,  HEMATOLOGY: No anemia, easy bruising or bleeding SKIN: No rash or lesion. MUSCULOSKELETAL: Positive for elbow pain.   NEUROLOGIC: No tingling, numbness, weakness.  PSYCHIATRY: No anxiety or depression.   MEDICATIONS AT HOME:   Prior to Admission medications   Medication Sig Start Date End Date Taking? Authorizing Provider  aspirin EC 81 MG tablet Take 81 mg by mouth daily.   Yes [provider]  hydrochlorothiazide (HYDRODIURIL) 12.5 MG tablet Take 1 tablet (12.5 mg total) by mouth daily. 07/22/17   Rudene Re, MD  naproxen (NAPROSYN) 500 MG tablet Take 1 tablet (500 mg total) by mouth 2 (two) times daily with a meal. Patient not taking: Reported on 07/22/2017 01/10/17 01/10/18  Merlyn Lot, MD      VITAL SIGNS:  Blood pressure (!) 141/88, pulse 74, temperature 98.2 F (36.8 C), temperature source Oral, resp. rate 18, height 5\' 10"  (1.778 m), weight 81.6 kg (180 lb), SpO2 98 %.  PHYSICAL EXAMINATION:  GENERAL:  61 y.o.-year-old patient lying in the bed with no acute distress.  EYES: Pupils  equal, round, reactive to light and accommodation.  Positive for scleral icterus. Extraocular muscles intact.  HEENT: Head atraumatic, normocephalic. Oropharynx and nasopharynx clear.  NECK:  Supple, no jugular venous distention. No thyroid enlargement, no tenderness.  LUNGS: Normal breath sounds bilaterally, no wheezing, rales,rhonchi or crepitation. No use of accessory muscles of respiration.  CARDIOVASCULAR: S1, S2 normal. No murmurs, rubs, or gallops.  ABDOMEN: Soft, epigastric tenderness, nondistended. Bowel sounds present. No organomegaly or mass.  EXTREMITIES: No pedal edema, cyanosis, or clubbing.   NEUROLOGIC: Cranial nerves II through XII are intact. Muscle strength 5/5 in all extremities. Sensation intact. Gait not checked.  PSYCHIATRIC: The patient is alert and oriented x 3.  SKIN: No rash, lesion, or ulcer.   LABORATORY PANEL:   CBC Recent Labs  Lab 11/07/17 1048  WBC 8.9  HGB 14.7  HCT 44.3  PLT 138*   ------------------------------------------------------------------------------------------------------------------  Chemistries  Recent Labs  Lab 11/07/17 1048  NA 136  K 3.9  CL 102  CO2 27  GLUCOSE 109*  BUN 10  CREATININE 0.87  CALCIUM 9.0  AST 123*  ALT 54  ALKPHOS 348*  BILITOT 4.6*   ------------------------------------------------------------------------------------------------------------------  Cardiac Enzymes No results for input(s): TROPONINI in the last 168 hours. ------------------------------------------------------------------------------------------------------------------  RADIOLOGY:  Ct Abdomen Pelvis W Contrast  Result Date: 11/07/2017 CLINICAL DATA:  Generalized abdominal pain with nausea. EXAM: CT ABDOMEN AND PELVIS WITH CONTRAST TECHNIQUE: Multidetector CT imaging of the abdomen and pelvis was performed using the standard protocol following bolus administration of intravenous contrast. CONTRAST:  12mL ISOVUE-300 IOPAMIDOL (ISOVUE-300) INJECTION 61% COMPARISON:  None. FINDINGS: Lower chest: Heart size upper normal. Coronary artery calcification is evident. 5 mm right lower lobe pulmonary nodule visible on image 9 series for. Subsegmental atelectasis noted both lower lobes. Hepatobiliary: 8 mm low-density lesion identified in the tip of the lateral segment left liver. Large complex 7.8 x 9.1 cm multilocular irregular cystic/necrotic lesion is identified in the central right liver. Lesion generates mass-effect on the intrahepatic segment of the IVC and low-attenuation in the region of the expected location of the right hepatic vein is  suspicious for right hepatic venous invasion extending right up to the IVC. Calcified gallstones are evident. No intra or extrahepatic biliary duct dilatation. Pancreas: No focal mass lesion. No dilatation of the main duct. No intraparenchymal cyst. No peripancreatic edema. Spleen: No splenomegaly. No focal mass lesion. Adrenals/Urinary Tract: No adrenal nodule or mass. Kidneys are unremarkable. No evidence for hydroureter. The urinary bladder appears normal for the degree of distention. Stomach/Bowel: Stomach is nondistended. No gastric wall thickening. No evidence of outlet obstruction. Duodenum is normally positioned as is the ligament of Treitz. No small bowel wall thickening. No small bowel dilatation. The terminal ileum is normal. The appendix is normal. No gross colonic mass. No colonic wall thickening. No substantial diverticular change. Vascular/Lymphatic: There is abdominal aortic atherosclerosis without aneurysm. 4.3 x 3.7 cm necrotic lymph node is identified in the hepatoduodenal ligament with other hepatoduodenal ligament lymphadenopathy. There is a filling defect in the portal vein (see image 35 series 2) consistent with thrombus that appears nonocclusive at this time although the vessel appears markedly attenuated near the bifurcation into the left and right portal veins. Reproductive: Prostate gland is markedly enlarged. Other: No intraperitoneal free fluid. Musculoskeletal: Bone windows reveal no worrisome lytic or sclerotic osseous lesions. IMPRESSION: 1. Large irregular multiloculated cystic/necrotic mass in the central liver abuts the intrahepatic IVC. The lesion is in the region of the right hepatic vein which  is either markedly attenuated or potentially invaded. This is associated with necrotic lymphadenopathy in the porta hepatis/hepatoduodenal ligament and thrombus within the portal vein. Imaging features may be related to metastatic disease although hepatocellular carcinoma would also be a  consideration especially if the lesion has invaded the right hepatic vein. Given the lymphadenopathy in the hepatoduodenal ligament, hepatic abscess is considered less likely. MRI of the abdomen without and with contrast would likely prove helpful to further evaluate. 2. 8 mm low-density lesion lateral segment left liver, indeterminate. This would be better assessed by MRI. 3.  Aortic Atherosclerois (ICD10-170.0) 4. Prostatomegaly. 5. Electronically Signed   By: Misty Stanley M.D.   On: 11/07/2017 15:49   US Abdomen Limited Ruq  Result Date: 11/07/2017 CLINICAL DATA:  Right upper quadrant pain EXAM: ULTRASOUND ABDOMEN LIMITED RIGHT UPPER QUADRANT COMPARISON:  07/22/2017 FINDINGS: Gallbladder: Gallbladder is well distended with multiple gallstones within. No wall thickening or pericholecystic fluid is noted. Common bile duct: Diameter: 4.7 mm Liver: There has been change in the interval from the prior exam. The portal vein is now occluded with thrombus. Additionally there is a diffuse heterogeneous area of decreased attenuation within the right lobe of the liver. This has progressed significantly in the interval from the prior exam. It now measures 7 cm in greatest dimension while previously measuring approximately 2.7 cm. A portion of this may be related to the occlusion of the portal vein although the possibility of a progressive mass lesion could not be totally excluded. Additionally an area of decreased echogenicity is noted within the left lobe of the liver which was not seen on the prior exam measuring 1.2 cm in greatest dimension. IMPRESSION: Stable cholelithiasis without significant complicating factors. 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 There are multiple areas of abnormal echogenicity within the liver which have increased in the interval from the prior exam. Although a portion of this may be related to the portal vein occlusion, evolving mass lesions are suspected. Further  evaluation by means of CT of the abdomen and pelvis with contrast is recommended. These results were called by telephone at the time of interpretation on 11/07/2017 at 2:07 pm to Dr. Larae Grooms , who verbally acknowledged these results. Electronically Signed   By: Inez Catalina M.D.   On: 11/07/2017 14:08      IMPRESSION AND PLAN:   1.  Liver mass with necrotic lymphadenopathy, concerning for invasion of hepatic vein on CT scan.  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  Case discussed with interventional radiology for biopsy.  Obtain MRI of the abdomen to see if there is invasion of hepatic vein or hepatic vein thrombosis.  Hold off on anticoagulation with the necrotic tissue there and possible invasion of hepatic vein.  Send off an alpha-fetoprotein and hepatitis profiles and HIV test.  Oncology consultation. 2.  Jaundice and elevated liver enzymes likely with liver mass 3.  Essential hypertension on hydrochlorothiazide 4.  Hold aspirin  All the records are reviewed and case discussed with ED provider. Management plans discussed with the patient, family and they are in agreement.  CODE STATUS: Full code  TOTAL TIME TAKING CARE OF THIS PATIENT: 50 minutes.    Loletha Grayer M.D on 11/07/2017 at 5:23 PM  Between 7am to 6pm - Pager - [phone]  After 6pm call admission pager [phone]  [facility]  [phone]  CC: Primary care physician; Patient, No Pcp Per

## 2017-11-07 NOTE — ED Provider Notes (Addendum)
-----------------------------------------   4:02 PM on 11/07/2017 -----------------------------------------  Signed out to me at 3; 65 pending ct. patient with what appears on ultrasound to be rapidly progressing oncologic pathology with a portal vein thrombosis.  Per prior physician, vascular surgery was consulted and they did request heparinization pending CT scan results.  CT scans did just result, which show  1. Large irregular multiloculated cystic/necrotic mass in the central liver abuts the intrahepatic IVC. The lesion is in the region of the right hepatic vein which is either markedly attenuated or potentially invaded. This is associated with necrotic lymphadenopathy in the porta hepatis/hepatoduodenal ligament and thrombus within the portal vein. Imaging features may be related to metastatic disease although hepatocellular carcinoma would also be a consideration especially if the lesion has invaded the right hepatic vein. Given the lymphadenopathy in the hepatoduodenal ligament, hepatic abscess is considered less likely. MRI of the abdomen without and with contrast would likely prove helpful to further evaluate. 2. 8 mm low-density lesion lateral segment left liver, indeterminate. This would be better assessed by MRI.    At this time we are paging GI, and oncology, we will likely have to heparinize the patient, patient will be admitted.  ----------------------------------------- 4:56 PM on 11/07/2017 -----------------------------------------   ----------------------------------------- 5:14 PM on 11/07/2017 -----------------------------------------  Vascular surgery does not feel anticoagulation is indicated at this time    Schuyler Amor, MD 11/07/17 1610    Schuyler Amor, MD 11/07/17 1715

## 2017-11-07 NOTE — Consult Note (Signed)
ANTICOAGULATION CONSULT NOTE - Follow Up Consult  Pharmacy Consult for Heparin Dosing and monitoring  Indication: portal vein thrombosis  No Known Allergies  Patient Measurements: Height: 5\' 10"  (177.8 cm) Weight: 180 lb (81.6 kg) IBW/kg (Calculated) : 73  Vital Signs: Temp: 98.2 F (36.8 C) (11/18 1039) Temp Source: Oral (11/18 1039) BP: 157/94 (11/18 1700) Pulse Rate: 83 (11/18 1700)  Labs: Recent Labs    11/07/17 1048 11/07/17 1339  HGB 14.7  --   HCT 44.3  --   PLT 138*  --   APTT  --  36  LABPROT  --  15.3*  INR  --  1.22  CREATININE 0.87  --     Estimated Creatinine Clearance: 92.1 mL/min (by C-G formula based on SCr of 0.87 mg/dL).  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

## 2017-11-07 NOTE — ED Triage Notes (Signed)
C/O intermittent abdominal pain x 2-3 months.  Seen through ED a couple of months ago and diagnosed with gallstones.  Patient states pain returned intermittently for the past month.  Denies emesis

## 2017-11-07 NOTE — Progress Notes (Signed)
Chaplain responded to a consult for prayer. [place] met pt, pt was lying on bed watching basketball. Pt told [place] that he had some spots on his liver and will have more tests to determine what it is. Pt states that he is anxious but hopeful. Pt also said he was feeling much better after taking medication this evening.  Pt requested pt as a means of coping, which [facility] provided with a ministry of presence.    11/07/17 2000  Clinical Encounter Type  Visited With Patient  Visit Type Initial;Spiritual support;Other (Comment)  Referral From Nurse  Consult/Referral To Chaplain  Spiritual Encounters  Spiritual Needs Prayer

## 2017-11-07 NOTE — ED Provider Notes (Signed)
[facility] Emergency Department Provider Note  ____________________________________________   First MD Initiated Contact with Patient 11/07/17 1154     (approximate)  I have reviewed the triage vital signs and the nursing notes.   HISTORY  Chief Complaint Abdominal Pain   HPI Tim Potter is a 61 y.o. male with a history of gallstones, hypertension and alcoholism who was coming into the emergency department today with worsening right upper quadrant abdominal pain.  He was last seen in August when he was diagnosed with gallstones.  He says that over the past couple days he is having worsening pain to the right side of his abdomen especially the right upper quadrant.  It is associated with nausea.  The pain is cramping in quality and is a 7 out of 10 at this time.  Past Medical History:  Diagnosis Date  . Gallstones   . Hypertension     There are no active problems to display for this patient.   History reviewed. No pertinent surgical history.  Prior to Admission medications   Medication Sig Start Date End Date Taking? Authorizing Provider  aspirin EC 81 MG tablet Take 81 mg by mouth daily.    [provider]  hydrochlorothiazide (HYDRODIURIL) 12.5 MG tablet Take 1 tablet (12.5 mg total) by mouth daily. 07/22/17   Rudene Re, MD  HYDROcodone-acetaminophen [facility]) 5-325 MG tablet Take 1 tablet by mouth every 4 (four) hours as needed for moderate pain. Patient not taking: Reported on 07/22/2017 01/10/17   Merlyn Lot, MD  naproxen (NAPROSYN) 500 MG tablet Take 1 tablet (500 mg total) by mouth 2 (two) times daily with a meal. Patient not taking: Reported on 07/22/2017 01/10/17 01/10/18  Merlyn Lot, MD    Allergies Patient has no known allergies.  No family history on file.  Social History Social History   Tobacco Use  . Smoking status: Current Every Day Smoker  . Smokeless tobacco: Never Used  Substance Use Topics  .  Alcohol use: Yes  . Drug use: No    Review of Systems  Constitutional: No fever/chills Eyes: No visual changes. ENT: No sore throat. Cardiovascular: Denies chest pain. Respiratory: Denies shortness of breath. Gastrointestinal: no vomiting.  No diarrhea.  No constipation. Genitourinary: Negative for dysuria. Musculoskeletal: Negative for back pain. Skin: Negative for rash. Neurological: Negative for headaches, focal weakness or numbness.   ____________________________________________   PHYSICAL EXAM:  VITAL SIGNS: ED Triage Vitals  Enc Vitals Group     BP 11/07/17 1039 (!) 147/100     Pulse Rate 11/07/17 1039 93     Resp 11/07/17 1039 18     Temp 11/07/17 1039 98.2 F (36.8 C)     Temp Source 11/07/17 1039 Oral     SpO2 11/07/17 1039 96 %     Weight 11/07/17 1039 180 lb (81.6 kg)     Height 11/07/17 1039 5\' 10"  (1.778 m)     Head Circumference --      Peak Flow --      Pain Score 11/07/17 1048 8     Pain Loc --      Pain Edu? --      Excl. in [place]? --     Constitutional: Alert and oriented. Well appearing and in no acute distress. Eyes: Conjunctivae are icteric Head: Atraumatic. Nose: No congestion/rhinnorhea. Mouth/Throat: Mucous membranes are moist.  Neck: No stridor.   Cardiovascular: Normal rate, regular rhythm. Grossly normal heart sounds.   Respiratory: Normal respiratory  effort.  No retractions. Lungs CTAB. Gastrointestinal: Soft with moderate right upper quadrant tenderness to palpation with a negative Murphy sign.  No distention.  Musculoskeletal: No lower extremity tenderness nor edema.  No joint effusions. Neurologic:  Normal speech and language. No gross focal neurologic deficits are appreciated. Skin:  Skin is warm, dry and intact. No rash noted. Psychiatric: Mood and affect are normal. Speech and behavior are normal.  ____________________________________________   LABS (all labs ordered are listed, but only abnormal results are displayed)  Labs  Reviewed  COMPREHENSIVE METABOLIC PANEL - Abnormal; Notable for the following components:      Result Value   Glucose, Bld 109 (*)    Total Protein 9.2 (*)    Albumin 2.9 (*)    AST 123 (*)    Alkaline Phosphatase 348 (*)    Total Bilirubin 4.6 (*)    All other components within normal limits  CBC - Abnormal; Notable for the following components:   RDW 14.8 (*)    Platelets 138 (*)    All other components within normal limits  URINALYSIS, COMPLETE (UACMP) WITH MICROSCOPIC - Abnormal; Notable for the following components:   Color, Urine ORANGE (*)    APPearance CLEAR (*)    Glucose, UA   (*)    Value: TEST NOT REPORTED DUE TO COLOR INTERFERENCE OF URINE PIGMENT   Hgb urine dipstick   (*)    Value: TEST NOT REPORTED DUE TO COLOR INTERFERENCE OF URINE PIGMENT   Bilirubin Urine   (*)    Value: TEST NOT REPORTED DUE TO COLOR INTERFERENCE OF URINE PIGMENT   Ketones, ur   (*)    Value: TEST NOT REPORTED DUE TO COLOR INTERFERENCE OF URINE PIGMENT   Protein, ur   (*)    Value: TEST NOT REPORTED DUE TO COLOR INTERFERENCE OF URINE PIGMENT   Nitrite   (*)    Value: TEST NOT REPORTED DUE TO COLOR INTERFERENCE OF URINE PIGMENT   Leukocytes, UA   (*)    Value: TEST NOT REPORTED DUE TO COLOR INTERFERENCE OF URINE PIGMENT   Squamous Epithelial / LPF 0-5 (*)    All other components within normal limits  LIPASE, BLOOD  PROTIME-INR   ____________________________________________  EKG   ____________________________________________  RADIOLOGY  Stable cholelithiasis without significant complicating factors.  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 There are multiple areas of abnormal echogenicity within the liver which have increased in the interval from the prior exam. Although a portion of this may be related to the portal vein occlusion, evolving mass lesions are suspected. Further evaluation by means of CT of the abdomen and pelvis with contrast is  recommended.  These results were called by telephone at the time of interpretation on 11/07/2017 at 2:07 pm to Dr. Larae Grooms , who verbally acknowledged these results. ____________________________________________   PROCEDURES  Procedure(s) performed:   Procedures  Critical Care performed:   ____________________________________________   INITIAL IMPRESSION / ASSESSMENT AND PLAN / ED COURSE  Pertinent labs & imaging results that were available during my care of the patient were reviewed by me and considered in my medical decision making (see chart for details).  Differential diagnosis includes, but is not limited to, biliary disease (biliary colic, acute cholecystitis, cholangitis, choledocholithiasis, etc), intrathoracic causes for epigastric abdominal pain including ACS, gastritis, duodenitis, pancreatitis, small bowel or large bowel obstruction, abdominal aortic aneurysm, hernia, and gastritis.  As part of my medical decision making, I reviewed the following  data within the electronic MEDICAL RECORD NUMBER Notes from prior ED visits   ----------------------------------------- 3:45 PM on 11/07/2017 -----------------------------------------  Patient with new portal venous thrombosis and also expanding liver lesions.  Concern for cancer.  Discussed case with Dr. Doy Hutching who recommends consultation with vascular surgery.  I discussed the case with Dr. Lorenso Courier who believes the patient may be appropriate for outpatient anticoagulation but will require CT scan for further differentiation of the lesions as well as consultation with gastroenterology.  Awaiting CT imaging at this time.  Signed out to Dr. Burlene Arnt.  Patient is pain-free at this time after being medicated.  He is aware of the diagnosis of the portal venous thrombosis as well as the expanding liver lesions.     ____________________________________________   FINAL CLINICAL IMPRESSION(S) / ED DIAGNOSES  Final diagnoses:  RUQ  pain  Liver masses.  Portal venous thrombosis.    NEW MEDICATIONS STARTED DURING THIS VISIT:  This SmartLink is deprecated. Use AVSMEDLIST instead to display the medication list for a patient.   Note:  This document was prepared using Dragon voice recognition software and may include unintentional dictation errors.     Orbie Pyo, MD 11/07/17 [phone]

## 2017-11-08 ENCOUNTER — Other Ambulatory Visit: Payer: Self-pay | Admitting: Oncology

## 2017-11-08 ENCOUNTER — Telehealth: Payer: Self-pay | Admitting: *Deleted

## 2017-11-08 ENCOUNTER — Telehealth: Payer: Self-pay | Admitting: Pharmacy Technician

## 2017-11-08 ENCOUNTER — Telehealth: Payer: Self-pay | Admitting: Oncology

## 2017-11-08 ENCOUNTER — Inpatient Hospital Stay: Payer: Medicaid Other

## 2017-11-08 ENCOUNTER — Other Ambulatory Visit: Payer: Self-pay

## 2017-11-08 ENCOUNTER — Other Ambulatory Visit: Payer: Self-pay | Admitting: *Deleted

## 2017-11-08 ENCOUNTER — Telehealth: Payer: Self-pay | Admitting: Pharmacist

## 2017-11-08 DIAGNOSIS — I10 Essential (primary) hypertension: Secondary | ICD-10-CM

## 2017-11-08 DIAGNOSIS — I81 Portal vein thrombosis: Secondary | ICD-10-CM

## 2017-11-08 DIAGNOSIS — Z79899 Other long term (current) drug therapy: Secondary | ICD-10-CM

## 2017-11-08 DIAGNOSIS — R160 Hepatomegaly, not elsewhere classified: Secondary | ICD-10-CM

## 2017-11-08 DIAGNOSIS — F17210 Nicotine dependence, cigarettes, uncomplicated: Secondary | ICD-10-CM

## 2017-11-08 DIAGNOSIS — C220 Liver cell carcinoma: Secondary | ICD-10-CM

## 2017-11-08 DIAGNOSIS — R634 Abnormal weight loss: Secondary | ICD-10-CM

## 2017-11-08 DIAGNOSIS — R5383 Other fatigue: Secondary | ICD-10-CM

## 2017-11-08 DIAGNOSIS — R1011 Right upper quadrant pain: Secondary | ICD-10-CM

## 2017-11-08 DIAGNOSIS — R5381 Other malaise: Secondary | ICD-10-CM

## 2017-11-08 DIAGNOSIS — D490 Neoplasm of unspecified behavior of digestive system: Secondary | ICD-10-CM

## 2017-11-08 LAB — BASIC METABOLIC PANEL
Anion gap: 6 (ref 5–15)
BUN: 10 mg/dL (ref 6–20)
CALCIUM: 8.2 mg/dL — AB (ref 8.9–10.3)
CHLORIDE: 101 mmol/L (ref 101–111)
CO2: 24 mmol/L (ref 22–32)
CREATININE: 0.93 mg/dL (ref 0.61–1.24)
GFR calc non Af Amer: >60 mL/min (ref >60)
GLUCOSE: 95 mg/dL (ref 65–99)
Potassium: 3.6 mmol/L (ref 3.5–5.1)
Sodium: 131 mmol/L — ABNORMAL LOW (ref 135–145)

## 2017-11-08 LAB — CBC
HCT: 39.8 % — ABNORMAL LOW (ref 40.0–52.0)
Hemoglobin: 13.1 g/dL (ref 13.0–18.0)
MCH: 31.1 pg (ref 26.0–34.0)
MCHC: 32.8 g/dL (ref 32.0–36.0)
MCV: 94.8 fL (ref 80.0–100.0)
PLATELETS: 128 10*3/uL — AB (ref 150–440)
RBC: 4.19 MIL/uL — AB (ref 4.40–5.90)
RDW: 14.6 % — ABNORMAL HIGH (ref 11.5–14.5)
WBC: 6.1 10*3/uL (ref 3.8–10.6)

## 2017-11-08 MED ORDER — ENOXAPARIN SODIUM 40 MG/0.4ML ~~LOC~~ SOLN
40.0000 mg | SUBCUTANEOUS | Status: DC
  Administered 2017-11-09: 08:00:00 40 mg via SUBCUTANEOUS
  Filled 2017-11-08: qty 0.4

## 2017-11-08 MED ORDER — MIDAZOLAM HCL 5 MG/5ML IJ SOLN
INTRAMUSCULAR | Status: AC
Start: 2017-11-08 — End: 2017-11-08
  Administered 2017-11-08: 09:00:00
  Filled 2017-11-08: qty 5

## 2017-11-08 MED ORDER — FENTANYL CITRATE (PF) 100 MCG/2ML IJ SOLN
INTRAMUSCULAR | Status: AC | PRN
  Administered 2017-11-08 (×2): 50 ug via INTRAVENOUS

## 2017-11-08 MED ORDER — AMLODIPINE BESYLATE 5 MG PO TABS
5.0000 mg | ORAL_TABLET | Freq: Every day | ORAL | Status: DC
  Administered 2017-11-09: 5 mg via ORAL
  Filled 2017-11-08: qty 1

## 2017-11-08 MED ORDER — LENVATINIB (12 MG DAILY DOSE) 3 X 4 MG PO CPPK: 12.0000 mg | ORAL_CAPSULE | Freq: Every day | ORAL | 3 refills | Status: DC

## 2017-11-08 MED ORDER — MIDAZOLAM HCL 2 MG/2ML IJ SOLN
INTRAMUSCULAR | Status: AC | PRN
  Administered 2017-11-08 (×2): 1 mg via INTRAVENOUS

## 2017-11-08 MED ORDER — FENTANYL CITRATE (PF) 100 MCG/2ML IJ SOLN
INTRAMUSCULAR | Status: AC
  Administered 2017-11-08: 09:00:00
  Filled 2017-11-08: qty 2

## 2017-11-08 MED ORDER — GADOBENATE DIMEGLUMINE 529 MG/ML IV SOLN
17.0000 mL | Freq: Once | INTRAVENOUS | Status: AC | PRN
  Administered 2017-11-08: 17 mL via INTRAVENOUS

## 2017-11-08 NOTE — Progress Notes (Signed)
Patient back from US guided liver mass biopsy. Site covered with bandaid, clean/dry/intact. No c/o pain. Instructed he is on bedrest until 12pm per Radiology RN

## 2017-11-08 NOTE — Sedation Documentation (Signed)
Liver biopsy done per DR. Kathlene Cote, pt tolerating well, vitals remained stable throughout procedure,.

## 2017-11-08 NOTE — Procedures (Signed)
Interventional Radiology Procedure Note  Procedure: US guided liver mass biopsy  Complications: None  Estimated Blood Loss: < 10 mL  Findings: 18 G core biopsy x 3 via 17 G needle of large right lobe hepatic mass.  Solid tissue obtained.  Venetia Night. Kathlene Cote, M.D Pager:  [phone]

## 2017-11-08 NOTE — Telephone Encounter (Signed)
Mailed patient a new patient packet.  [facility]

## 2017-11-08 NOTE — Telephone Encounter (Signed)
Error

## 2017-11-08 NOTE — Progress Notes (Addendum)
[place] [facility] [place] NAME: Tim Potter    MR#:  [id number]  DATE OF BIRTH:  Sep 26, 1956  SUBJECTIVE:  CHIEF COMPLAINT:   Chief Complaint  Patient presents with  . Abdominal Pain   - Abdominal pain for several months now, noted to have a mass on the right lobe of the liver -Status post liver biopsy today.  REVIEW OF SYSTEMS:  Review of Systems  Constitutional: Positive for malaise/fatigue and weight loss. Negative for chills and fever.  HENT: Negative for congestion, ear discharge, hearing loss and nosebleeds.   Eyes: Negative for blurred vision and double vision.  Respiratory: Negative for cough, shortness of breath and wheezing.   Cardiovascular: Negative for chest pain and palpitations.  Gastrointestinal: Positive for abdominal pain. Negative for constipation, diarrhea, nausea and vomiting.  Genitourinary: Negative for dysuria.  Musculoskeletal: Negative for myalgias.  Neurological: Negative for dizziness, speech change, focal weakness, seizures and headaches.  Psychiatric/Behavioral: Negative for depression.    DRUG ALLERGIES:  No Known Allergies  VITALS:  Blood pressure (!) 125/96, pulse 81, temperature 98.2 F (36.8 C), temperature source Oral, resp. rate 17, height 5\' 10"  (1.778 m), weight 85.7 kg (189 lb), SpO2 95 %.  PHYSICAL EXAMINATION:  Physical Exam  GENERAL:  61 y.o.-year-old patient lying in the bed with no acute distress.  EYES: Pupils equal, round, reactive to light and accommodation. No scleral icterus. Extraocular muscles intact.  HEENT: Head atraumatic, normocephalic. Oropharynx and nasopharynx clear.  NECK:  Supple, no jugular venous distention. No thyroid enlargement, no tenderness.  LUNGS: Normal breath sounds bilaterally, no wheezing, rales,rhonchi or crepitation. No use of accessory muscles of respiration.  CARDIOVASCULAR: S1, S2 normal. No murmurs, rubs, or gallops.  ABDOMEN: Soft, some discomfort in  the right upper quadrant, dressing in place from the liver biopsy. nontender, nondistended. Bowel sounds present. No organomegaly or mass.  EXTREMITIES: No pedal edema, cyanosis, or clubbing.  NEUROLOGIC: Cranial nerves II through XII are intact. Muscle strength 5/5 in all extremities. Sensation intact. Gait not checked.  PSYCHIATRIC: The patient is alert and oriented x 3.  SKIN: No obvious rash, lesion, or ulcer.    LABORATORY PANEL:   CBC Recent Labs  Lab 11/08/17 0410  WBC 6.1  HGB 13.1  HCT 39.8*  PLT 128*   ------------------------------------------------------------------------------------------------------------------  Chemistries  Recent Labs  Lab 11/07/17 1048 11/08/17 0410  NA 136 131*  K 3.9 3.6  CL 102 101  CO2 27 24  GLUCOSE 109* 95  BUN 10 10  CREATININE 0.87 0.93  CALCIUM 9.0 8.2*  AST 123*  --   ALT 54  --   ALKPHOS 348*  --   BILITOT 4.6*  --    ------------------------------------------------------------------------------------------------------------------  Cardiac Enzymes No results for input(s): TROPONINI in the last 168 hours. ------------------------------------------------------------------------------------------------------------------  RADIOLOGY:  Ct Abdomen Pelvis W Contrast  Result Date: 11/07/2017 CLINICAL DATA:  Generalized abdominal pain with nausea. EXAM: CT ABDOMEN AND PELVIS WITH CONTRAST TECHNIQUE: Multidetector CT imaging of the abdomen and pelvis was performed using the standard protocol following bolus administration of intravenous contrast. CONTRAST:  135mL ISOVUE-300 IOPAMIDOL (ISOVUE-300) INJECTION 61% COMPARISON:  None. FINDINGS: Lower chest: Heart size upper normal. Coronary artery calcification is evident. 5 mm right lower lobe pulmonary nodule visible on image 9 series for. Subsegmental atelectasis noted both lower lobes. Hepatobiliary: 8 mm low-density lesion identified in the tip of the lateral segment left liver. Large  complex 7.8 x 9.1 cm multilocular irregular cystic/necrotic  lesion is identified in the central right liver. Lesion generates mass-effect on the intrahepatic segment of the IVC and low-attenuation in the region of the expected location of the right hepatic vein is suspicious for right hepatic venous invasion extending right up to the IVC. Calcified gallstones are evident. No intra or extrahepatic biliary duct dilatation. Pancreas: No focal mass lesion. No dilatation of the main duct. No intraparenchymal cyst. No peripancreatic edema. Spleen: No splenomegaly. No focal mass lesion. Adrenals/Urinary Tract: No adrenal nodule or mass. Kidneys are unremarkable. No evidence for hydroureter. The urinary bladder appears normal for the degree of distention. Stomach/Bowel: Stomach is nondistended. No gastric wall thickening. No evidence of outlet obstruction. Duodenum is normally positioned as is the ligament of Treitz. No small bowel wall thickening. No small bowel dilatation. The terminal ileum is normal. The appendix is normal. No gross colonic mass. No colonic wall thickening. No substantial diverticular change. Vascular/Lymphatic: There is abdominal aortic atherosclerosis without aneurysm. 4.3 x 3.7 cm necrotic lymph node is identified in the hepatoduodenal ligament with other hepatoduodenal ligament lymphadenopathy. There is a filling defect in the portal vein (see image 35 series 2) consistent with thrombus that appears nonocclusive at this time although the vessel appears markedly attenuated near the bifurcation into the left and right portal veins. Reproductive: Prostate gland is markedly enlarged. Other: No intraperitoneal free fluid. Musculoskeletal: Bone windows reveal no worrisome lytic or sclerotic osseous lesions. IMPRESSION: 1. Large irregular multiloculated cystic/necrotic mass in the central liver abuts the intrahepatic IVC. The lesion is in the region of the right hepatic vein which is either markedly  attenuated or potentially invaded. This is associated with necrotic lymphadenopathy in the porta hepatis/hepatoduodenal ligament and thrombus within the portal vein. Imaging features may be related to metastatic disease although hepatocellular carcinoma would also be a consideration especially if the lesion has invaded the right hepatic vein. Given the lymphadenopathy in the hepatoduodenal ligament, hepatic abscess is considered less likely. MRI of the abdomen without and with contrast would likely prove helpful to further evaluate. 2. 8 mm low-density lesion lateral segment left liver, indeterminate. This would be better assessed by MRI. 3.  Aortic Atherosclerois (ICD10-170.0) 4. Prostatomegaly. 5. Electronically Signed   By: Misty Stanley M.D.   On: 11/07/2017 15:49   Mr Liver W Wo Contrast  Result Date: 11/08/2017 CLINICAL DATA:  Liver masses and porta hepatis adenopathy on CT. No primary malignancy history submitted. EXAM: MRI ABDOMEN WITHOUT AND WITH CONTRAST TECHNIQUE: Multiplanar multisequence MR imaging of the abdomen was performed both before and after the administration of intravenous contrast. CONTRAST:  104mL MULTIHANCE GADOBENATE DIMEGLUMINE 529 MG/ML IV SOLN COMPARISON:  CT of 11/07/2017. Ultrasounds including back to 07/22/2017. FINDINGS: Lower chest: Borderline cardiomegaly, without pericardial or pleural effusion. Hepatobiliary: Caudate lobe enlargement, without convincing evidence of cirrhosis. Suspicious lateral segment left liver lobe 9 mm lesion on image 38/series 13. Dominant mass centered within the central high right hepatic lobe. Segments 7 and 8. Example 8.9 x 7.5 cm on image 10/series 4. There is extension medially and superiorly along the course of the right hepatic vein, which is involved with tumor. Presumed tumor thrombus extends into the IVC, including image 20/series 13. The middle hepatic vein is displaced and likely also involved with tumor on image 23/series 13. Tumor tracks  along and presumably involves both portal vein branches, including on series 13. Multiple gallstones. Gallbladder wall thickening at 6 mm on image 20/series 4. No pericholecystic fluid identified. Borderline to mild intrahepatic biliary duct  dilatation. The common duct is normal in caliber. Pancreas:  Normal, without mass or ductal dilatation. Spleen:  Normal in size, without focal abnormality. Adrenals/Urinary Tract: Normal adrenal glands. Normal kidneys, without hydronephrosis. Stomach/Bowel: Normal stomach and abdominal bowel loops. Vascular/Lymphatic: Aortic and branch vessel atherosclerosis. Branch portal vein thrombus, as detailed above. The main portal vein has thrombus within on image 50/series 13. Necrotic adenopathy in the porta hepatis, including 3.9 x 3.4 cm on image 16/series 4. Other:  No ascites. Musculoskeletal: Burtis Junes a hemangioma within the right-side of the L1 vertebral body. IMPRESSION: 1. Dominant high right hepatic lobe mass with hepatic and portal vein involvement as detailed above. These characteristics favor hepatocellular carcinoma. Isolated metastatic disease, including from colon cancer, could look similar but is felt much less likely. 2. Suspicious lateral segment left liver lobe lesion, likely metastatic disease. 3. Nodal metastasis within the porta hepatis. 4. Borderline to mild intrahepatic biliary duct dilatation, likely secondary to mass effect from the porta hepatis adenopathy. 5. Cholelithiasis with nonspecific gallbladder wall thickening. Electronically Signed   By: Abigail Miyamoto M.D.   On: 11/08/2017 09:07   US Biopsy (liver)  Result Date: 11/08/2017 CLINICAL DATA:  Right lobe hepatic mass with hepatic vein and portal vein invasion by MRI. The patient presents for biopsy. EXAM: ULTRASOUND GUIDED CORE BIOPSY OF LIVER MASS MEDICATIONS: 2.0 mg IV Versed; 100 mcg IV Fentanyl Total Moderate Sedation Time: 10 minutes. The patient's level of consciousness and physiologic status were  continuously monitored during the procedure by Radiology nursing. PROCEDURE: The procedure, risks, benefits, and alternatives were explained to the patient. Questions regarding the procedure were encouraged and answered. The patient understands and consents to the procedure. A time out was performed prior to initiating the procedure. Ultrasound was performed of the liver. The right abdominal wall was prepped with chlorhexidine in a sterile fashion, and a sterile drape was applied covering the operative field. A sterile gown and sterile gloves were used for the procedure. Local anesthesia was provided with 1% Lidocaine. Under ultrasound guidance, a 17 gauge needle was advanced into the right lobe of the liver. Three separate coaxial 18 gauge core biopsy samples were obtained of a right lobe liver mass. Material was submitted in formalin. After the procedure Gel-Foam pledgets were advanced through the outer needle as it was retracted. COMPLICATIONS: None. FINDINGS: Large, lobulated, hypoechoic mass is present within the posterior aspect of the right lobe of the liver measuring at least 9 cm in maximum diameter. Solid tissue was obtained from the mass. IMPRESSION: Ultrasound-guided core biopsy performed of a large mass within the right lobe of the liver. Electronically Signed   By: Aletta Edouard M.D.   On: 11/08/2017 10:07   US Abdomen Limited Ruq  Result Date: 11/07/2017 CLINICAL DATA:  Right upper quadrant pain EXAM: ULTRASOUND ABDOMEN LIMITED RIGHT UPPER QUADRANT COMPARISON:  07/22/2017 FINDINGS: Gallbladder: Gallbladder is well distended with multiple gallstones within. No wall thickening or pericholecystic fluid is noted. Common bile duct: Diameter: 4.7 mm Liver: There has been change in the interval from the prior exam. The portal vein is now occluded with thrombus. Additionally there is a diffuse heterogeneous area of decreased attenuation within the right lobe of the liver. This has progressed  significantly in the interval from the prior exam. It now measures 7 cm in greatest dimension while previously measuring approximately 2.7 cm. A portion of this may be related to the occlusion of the portal vein although the possibility of a progressive mass lesion could not  be totally excluded. Additionally an area of decreased echogenicity is noted within the left lobe of the liver which was not seen on the prior exam measuring 1.2 cm in greatest dimension. IMPRESSION: Stable cholelithiasis without significant complicating factors. 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 There are multiple areas of abnormal echogenicity within the liver which have increased in the interval from the prior exam. Although a portion of this may be related to the portal vein occlusion, evolving mass lesions are suspected. Further evaluation by means of CT of the abdomen and pelvis with contrast is recommended. These results were called by telephone at the time of interpretation on 11/07/2017 at 2:07 pm to Dr. Larae Grooms , who verbally acknowledged these results. Electronically Signed   By: Inez Catalina M.D.   On: 11/07/2017 14:08    EKG:   Orders placed or performed during the hospital encounter of 07/22/17  . ED EKG  . ED EKG  . EKG    ASSESSMENT AND PLAN:   61 year old male with past medical history significant for hypertension and gout presents to hospital secondary to months history of right upper quadrant abdominal pain and CT confirmed a large liver mass  #1 Liver mass- large mass in the right lobe of the liver possibly invading hepatic vein and portal vein thrombosis -Status post liver biopsy today. CEA, alpha-fetoprotein and hepatitis panel ordered -No history of prior colonoscopy. -Oncology consult is pending -Pain is much improved now. -MRI of the liver done today -Possible discharge once seen by oncology and outpatient follow-up  #2 hypertension-on low-dose hydrochlorothiazide,  discontinued due to sodium trending on the lower side Add norvasc  #3 DVT prophylaxis-we'll start Lovenox     All the records are reviewed and case discussed with Care Management/Social Workerr. Management plans discussed with the patient, family and they are in agreement.  CODE STATUS: Full code  TOTAL TIME TAKING CARE OF THIS PATIENT: 37 minutes.   POSSIBLE D/C tomorrow, DEPENDING ON CLINICAL CONDITION.   Gladstone Lighter M.D on 11/08/2017 at 1:22 PM  Between 7am to 6pm - Pager - [phone]  After 6pm go to [email or web address] - password [email or web address] [facility]  Office  [phone]  CC: Primary care physician; Patient, No Pcp Per

## 2017-11-08 NOTE — Telephone Encounter (Signed)
Patient scheduled for Labs and Hosp F/U on 11/16/17 per Dr Rao/msg rout.  New patient packet left at Registration for patient to complete upon arrival. Left msg on pa tient's vm. Dr Janese Banks advised .

## 2017-11-08 NOTE — H&P (Signed)
Chief Complaint: Patient was seen in consultation today for liver biopsy at the request of Dr. Loletha Grayer  Referring Physician(s): Loletha Grayer, MD  Patient Status: [facility] - In-pt  History of Present Illness: Tim Potter is a 61 y.o. 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  Likely HCC by imaging but tumor markers still pending.  Now referred for ultrasound guided biopsy.  Past Medical History:  Diagnosis Date  . Carpal tunnel syndrome   . Gallstones   . Gout   . Hypertension     Past Surgical History:  Procedure Laterality Date  . NO PAST SURGERIES      Allergies: Patient has no known allergies.  Medications: Prior to Admission medications   Medication Sig Start Date End Date Taking? Authorizing Provider  aspirin EC 81 MG tablet Take 81 mg by mouth daily.   Yes [provider]  hydrochlorothiazide (HYDRODIURIL) 12.5 MG tablet Take 1 tablet (12.5 mg total) by mouth daily. 07/22/17   Rudene Re, MD  naproxen (NAPROSYN) 500 MG tablet Take 1 tablet (500 mg total) by mouth 2 (two) times daily with a meal. Patient not taking: Reported on 07/22/2017 01/10/17 01/10/18  Merlyn Lot, MD     Family History  Problem Relation Age of Onset  . Cancer Mother   . CVA Father     Social History   Socioeconomic History  . Marital status: Single    Spouse name: None  . Number of children: None  . Years of education: None  . Highest education level: None  Social Needs  . Financial resource strain: None  . Food insecurity - worry: None  . Food insecurity - inability: None  . Transportation needs - medical: None  . Transportation needs - non-medical: None  Occupational History  . None  Tobacco Use  . Smoking status: Current Every Day Smoker  . Smokeless tobacco: Never Used    Substance and Sexual Activity  . Alcohol use: Yes    Comment: 2-24 oz beer 4X per week  . Drug use: No  . Sexual activity: None  Other Topics Concern  . None  Social History Narrative  . None    Review of Systems: A 12 point ROS discussed and pertinent positives are indicated in the HPI above.  All other systems are negative.  Review of Systems  Constitutional: Negative.   Respiratory: Negative.   Cardiovascular: Negative.   Gastrointestinal: Positive for abdominal pain. Negative for abdominal distention, blood in stool, constipation, diarrhea, nausea and vomiting.  Genitourinary: Negative.   Musculoskeletal: Negative.   Neurological: Negative.     Vital Signs: BP (!) 140/92   Pulse 86   Temp 98.2 F (36.8 C) (Oral)   Resp 20   Ht 5\' 10"  (1.778 m)   Wt 189 lb (85.7 kg)   SpO2 98%   BMI 27.12 kg/m   Physical Exam  Constitutional: He is oriented to person, place, and time. No distress.  Cardiovascular: Normal rate, regular rhythm and normal heart sounds. Exam reveals no gallop.  No murmur heard. Pulmonary/Chest: Effort normal and breath sounds normal. No stridor. No respiratory distress. He has no wheezes. He has no rales.  Abdominal: Soft. Bowel sounds are normal. He exhibits no distension. There is no tenderness.  Neurological: He is alert and oriented to person, place, and time.  Vitals reviewed.   Imaging: Ct Abdomen Pelvis W Contrast  Result Date: 11/07/2017 CLINICAL DATA:  Generalized abdominal pain with nausea. EXAM: CT ABDOMEN AND PELVIS WITH CONTRAST TECHNIQUE: Multidetector CT imaging of the abdomen and pelvis was performed using the standard protocol following bolus administration of intravenous contrast. CONTRAST:  19mL ISOVUE-300 IOPAMIDOL (ISOVUE-300) INJECTION 61% COMPARISON:  None. FINDINGS: Lower chest: Heart size upper normal. Coronary artery calcification is evident. 5 mm right lower lobe pulmonary nodule visible on image 9 series for. Subsegmental  atelectasis noted both lower lobes. Hepatobiliary: 8 mm low-density lesion identified in the tip of the lateral segment left liver. Large complex 7.8 x 9.1 cm multilocular irregular cystic/necrotic lesion is identified in the central right liver. Lesion generates mass-effect on the intrahepatic segment of the IVC and low-attenuation in the region of the expected location of the right hepatic vein is suspicious for right hepatic venous invasion extending right up to the IVC. Calcified gallstones are evident. No intra or extrahepatic biliary duct dilatation. Pancreas: No focal mass lesion. No dilatation of the main duct. No intraparenchymal cyst. No peripancreatic edema. Spleen: No splenomegaly. No focal mass lesion. Adrenals/Urinary Tract: No adrenal nodule or mass. Kidneys are unremarkable. No evidence for hydroureter. The urinary bladder appears normal for the degree of distention. Stomach/Bowel: Stomach is nondistended. No gastric wall thickening. No evidence of outlet obstruction. Duodenum is normally positioned as is the ligament of Treitz. No small bowel wall thickening. No small bowel dilatation. The terminal ileum is normal. The appendix is normal. No gross colonic mass. No colonic wall thickening. No substantial diverticular change. Vascular/Lymphatic: There is abdominal aortic atherosclerosis without aneurysm. 4.3 x 3.7 cm necrotic lymph node is identified in the hepatoduodenal ligament with other hepatoduodenal ligament lymphadenopathy. There is a filling defect in the portal vein (see image 35 series 2) consistent with thrombus that appears nonocclusive at this time although the vessel appears markedly attenuated near the bifurcation into the left and right portal veins. Reproductive: Prostate gland is markedly enlarged. Other: No intraperitoneal free fluid. Musculoskeletal: Bone windows reveal no worrisome lytic or sclerotic osseous lesions. IMPRESSION: 1. Large irregular multiloculated cystic/necrotic  mass in the central liver abuts the intrahepatic IVC. The lesion is in the region of the right hepatic vein which is either markedly attenuated or potentially invaded. This is associated with necrotic lymphadenopathy in the porta hepatis/hepatoduodenal ligament and thrombus within the portal vein. Imaging features may be related to metastatic disease although hepatocellular carcinoma would also be a consideration especially if the lesion has invaded the right hepatic vein. Given the lymphadenopathy in the hepatoduodenal ligament, hepatic abscess is considered less likely. MRI of the abdomen without and with contrast would likely prove helpful to further evaluate. 2. 8 mm low-density lesion lateral segment left liver, indeterminate. This would be better assessed by MRI. 3.  Aortic Atherosclerois (ICD10-170.0) 4. Prostatomegaly. 5. Electronically Signed   By: Misty Stanley M.D.   On: 11/07/2017 15:49   Mr Liver W Wo Contrast  Result Date: 11/08/2017 CLINICAL DATA:  Liver masses and porta hepatis adenopathy on CT. No primary malignancy history submitted. EXAM: MRI ABDOMEN WITHOUT AND WITH CONTRAST TECHNIQUE: Multiplanar multisequence MR imaging of the abdomen was performed both before and after the administration of intravenous contrast. CONTRAST:  61mL MULTIHANCE GADOBENATE DIMEGLUMINE 529 MG/ML IV SOLN COMPARISON:  CT of 11/07/2017. Ultrasounds including back to 07/22/2017. FINDINGS: Lower chest: Borderline cardiomegaly, without pericardial or pleural effusion. Hepatobiliary: Caudate lobe enlargement, without convincing evidence of cirrhosis. Suspicious lateral  segment left liver lobe 9 mm lesion on image 38/series 13. Dominant mass centered within the central high right hepatic lobe. Segments 7 and 8. Example 8.9 x 7.5 cm on image 10/series 4. There is extension medially and superiorly along the course of the right hepatic vein, which is involved with tumor. Presumed tumor thrombus extends into the IVC,  including image 20/series 13. The middle hepatic vein is displaced and likely also involved with tumor on image 23/series 13. Tumor tracks along and presumably involves both portal vein branches, including on series 13. Multiple gallstones. Gallbladder wall thickening at 6 mm on image 20/series 4. No pericholecystic fluid identified. Borderline to mild intrahepatic biliary duct dilatation. The common duct is normal in caliber. Pancreas:  Normal, without mass or ductal dilatation. Spleen:  Normal in size, without focal abnormality. Adrenals/Urinary Tract: Normal adrenal glands. Normal kidneys, without hydronephrosis. Stomach/Bowel: Normal stomach and abdominal bowel loops. Vascular/Lymphatic: Aortic and branch vessel atherosclerosis. Branch portal vein thrombus, as detailed above. The main portal vein has thrombus within on image 50/series 13. Necrotic adenopathy in the porta hepatis, including 3.9 x 3.4 cm on image 16/series 4. Other:  No ascites. Musculoskeletal: Burtis Junes a hemangioma within the right-side of the L1 vertebral body. IMPRESSION: 1. Dominant high right hepatic lobe mass with hepatic and portal vein involvement as detailed above. These characteristics favor hepatocellular carcinoma. Isolated metastatic disease, including from colon cancer, could look similar but is felt much less likely. 2. Suspicious lateral segment left liver lobe lesion, likely metastatic disease. 3. Nodal metastasis within the porta hepatis. 4. Borderline to mild intrahepatic biliary duct dilatation, likely secondary to mass effect from the porta hepatis adenopathy. 5. Cholelithiasis with nonspecific gallbladder wall thickening. Electronically Signed   By: Abigail Miyamoto M.D.   On: 11/08/2017 09:07   US Abdomen Limited Ruq  Result Date: 11/07/2017 CLINICAL DATA:  Right upper quadrant pain EXAM: ULTRASOUND ABDOMEN LIMITED RIGHT UPPER QUADRANT COMPARISON:  07/22/2017 FINDINGS: Gallbladder: Gallbladder is well distended with multiple  gallstones within. No wall thickening or pericholecystic fluid is noted. Common bile duct: Diameter: 4.7 mm Liver: There has been change in the interval from the prior exam. The portal vein is now occluded with thrombus. Additionally there is a diffuse heterogeneous area of decreased attenuation within the right lobe of the liver. This has progressed significantly in the interval from the prior exam. It now measures 7 cm in greatest dimension while previously measuring approximately 2.7 cm. A portion of this may be related to the occlusion of the portal vein although the possibility of a progressive mass lesion could not be totally excluded. Additionally an area of decreased echogenicity is noted within the left lobe of the liver which was not seen on the prior exam measuring 1.2 cm in greatest dimension. IMPRESSION: Stable cholelithiasis without significant complicating factors. 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 There are multiple areas of abnormal echogenicity within the liver which have increased in the interval from the prior exam. Although a portion of this may be related to the portal vein occlusion, evolving mass lesions are suspected. Further evaluation by means of CT of the abdomen and pelvis with contrast is recommended. These results were called by telephone at the time of interpretation on 11/07/2017 at 2:07 pm to Dr. Larae Grooms , who verbally acknowledged these results. Electronically Signed   By: Inez Catalina M.D.   On: 11/07/2017 14:08    Labs:  CBC: Recent Labs    01/10/17 0821 07/22/17  5809 11/07/17 1048 11/08/17 0410  WBC 5.9 7.0 8.9 6.1  HGB 13.5 14.6 14.7 13.1  HCT 41.2 42.7 44.3 39.8*  PLT 171 136* 138* 128*    COAGS: Recent Labs    11/07/17 1339  INR 1.22  APTT 36    BMP: Recent Labs    01/10/17 0821 07/22/17 0713 11/07/17 1048 11/08/17 0410  NA 141 137 136 131*  K 3.9 3.5 3.9 3.6  CL 108 104 102 101  CO2 27 24 27 24   GLUCOSE  104* 124* 109* 95  BUN 16 12 10 10   CALCIUM 9.0 9.1 9.0 8.2*  CREATININE 0.95 1.01 0.87 0.93  GFRNONAA >60 >60 >60 >60  GFRAA >60 >60 >60 >60    LIVER FUNCTION TESTS: Recent Labs    07/22/17 0713 11/07/17 1048  BILITOT 1.7* 4.6*  AST 161* 123*  ALT 107* 54  ALKPHOS 134* 348*  PROT 8.6* 9.2*  ALBUMIN 3.8 2.9*    Assessment and Plan:  For US guided biopsy of right hepatic lobe mass today.   Risks and benefits discussed with the patient including, but not limited to bleeding, infection, damage to adjacent structures or low yield requiring additional tests.All of the patient's questions were answered, patient is agreeable to proceed. Consent signed and in chart.  Thank you for this interesting consult.  I greatly enjoyed meeting BENECIO KLUGER and look forward to participating in their care.  A copy of this report was sent to the requesting provider on this date.  Electronically Signed: Azzie Roup, MD 11/08/2017, 9:11 AM   I spent a total of 20 Minutes in face to face in clinical consultation, greater than 50% of which was counseling/coordinating care for liver biopsy.

## 2017-11-08 NOTE — Progress Notes (Signed)
Patient reminded to remain NPO for liver biopsy this morning. No c/o pain- resting quietly in bed.

## 2017-11-08 NOTE — Telephone Encounter (Signed)
Oral Oncology Pharmacist Encounter  Received new prescription for Lenvima (lenvatinib) for the treatment of hepatocellular carcinoma ([place]), planned duration until disease progression or unacceptable drug toxicity.  More recent labs will be collected at follow-up outpatient oncology office visit. Prescription dose and frequency assessed.   Current medication list in Epic reviewed, with preadmission home medication list there are no DDIs with Lenvima (lenvatinib) identified. Another DDI should be completed if Tim Potter medication list changes at discharge.  Due to the patient not having prescription insurance, patient advocate Tonye Pearson will help the patient obtain the Lenvima from the manufacturer.   Oral Oncology Clinic will continue to follow for medication assess and initial counseling will be provided at his office visit.  Darl Pikes, PharmD, BCPS Hematology/Oncology Clinical Pharmacist [facility] [phone]  11/08/2017 3:39 PM

## 2017-11-08 NOTE — Consult Note (Signed)
Hematology/Oncology Consult note [facility] [phone] [phone]  Patient Care Team: Patient, No Pcp Per as PCP - General (General Practice)   Name of the patient: Tim Potter  [id number]  [birth year]    Reason for consult: Liver mass   Requesting physician: Dr. Tressia Miners  Date of visit: 11/08/2017    History of presenting illness-patient is a 61 year old African-American male with a past medical history significant for hypertension who presented to the ER with symptoms of abdominal pain in the epigastrium which did not get better with NSAIDs.    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  Multiple areas of abnormal echogenicity within the liver which have increased since prior exam.  Evolving mass lesions are suspected.  This was followed by a CT abdomen and pelvis with contrast which showed a large irregular multiloculated necrotic mass in the central liver measuring about 7.8 x 9.1 cm.  4.3 x 3.7 cm necrotic lymph node in the hepatoduodenal ligament with other hepatoduodenal ligament lymphadenopathy.  The filling defect in the portal vein which I reviewed with radiology and consistent with tumor thrombus.  This was followed by MRI of the liver with and without contrast which showed a dominant high right hepatic lobe mass with hepatic and portal vein involvement.  Characteristics favor hepatocellular carcinoma.  Isolated metastatic disease including from colon cancer can look familiar but felt less likely.  Suspicious lateral segment left liver lobe lesion likely metastatic disease.  Nodal metastases within the porta hepatis.  The patient does not have any known chronic liver disease.  No significant cirrhosis was noted on current imaging.  HIV hepatitis B and hepatitis C as well as AFP has been ordered and is currently pending  Patient reports abdominal pain is better with  oxycodone. He drinks 24 oz of beer daily and has been doing so for many years. He lives alone in [place]. His uncle and kids live close by. 8-10 pouns weight loss over 1 year. Appetite is good. He denies other complaints  ECOG PS- 0  Pain scale- 3   Review of systems- Review of Systems  Constitutional: Positive for malaise/fatigue. Negative for chills, fever and weight loss.  HENT: Negative for congestion, ear discharge and nosebleeds.   Eyes: Negative for blurred vision.  Respiratory: Negative for cough, hemoptysis, sputum production, shortness of breath and wheezing.   Cardiovascular: Negative for chest pain, palpitations, orthopnea and claudication.  Gastrointestinal: Positive for abdominal pain. Negative for blood in stool, constipation, diarrhea, heartburn, melena, nausea and vomiting.  Genitourinary: Negative for dysuria, flank pain, frequency, hematuria and urgency.  Musculoskeletal: Negative for back pain, joint pain and myalgias.  Skin: Negative for rash.  Neurological: Negative for dizziness, tingling, focal weakness, seizures, weakness and headaches.  Endo/Heme/Allergies: Does not bruise/bleed easily.  Psychiatric/Behavioral: Negative for depression and suicidal ideas. The patient does not have insomnia.     No Known Allergies  Patient Active Problem List   Diagnosis Date Noted  . Liver mass 11/07/2017     Past Medical History:  Diagnosis Date  . Carpal tunnel syndrome   . Gallstones   . Gout   . Hypertension      Past Surgical History:  Procedure Laterality Date  . NO PAST SURGERIES      Social History   Socioeconomic History  . Marital status: Single    Spouse name: Not on file  . Number of children: Not on  file  . Years of education: Not on file  . Highest education level: Not on file  Social Needs  . Financial resource strain: Not on file  . Food insecurity - worry: Not on file  . Food insecurity - inability: Not on file  . Transportation needs -  medical: Not on file  . Transportation needs - non-medical: Not on file  Occupational History  . Not on file  Tobacco Use  . Smoking status: Current Every Day Smoker  . Smokeless tobacco: Never Used  Substance and Sexual Activity  . Alcohol use: Yes    Comment: 2-24 oz beer 4X per week  . Drug use: No  . Sexual activity: Not on file  Other Topics Concern  . Not on file  Social History Narrative  . Not on file     Family History  Problem Relation Age of Onset  . Cancer Mother   . CVA Father      Current Facility-Administered Medications:  .  acetaminophen (TYLENOL) tablet 650 mg, 650 mg, Oral, Q6H PRN **OR** acetaminophen (TYLENOL) suppository 650 mg, 650 mg, Rectal, Q6H PRN, Wieting, Richard, MD .  fentaNYL (SUBLIMAZE) 100 MCG/2ML injection, , , ,  .  hydrochlorothiazide (HYDRODIURIL) tablet 12.5 mg, 12.5 mg, Oral, Daily, Wieting, Richard, MD, 12.5 mg at 11/07/17 1849 .  midazolam (VERSED) 5 MG/5ML injection, , , ,  .  morphine 2 MG/ML injection 2 mg, 2 mg, Intravenous, Q3H PRN, Leslye Peer, Richard, MD .  ondansetron (ZOFRAN) tablet 4 mg, 4 mg, Oral, Q6H PRN **OR** ondansetron (ZOFRAN) injection 4 mg, 4 mg, Intravenous, Q6H PRN, Wieting, Richard, MD .  oxyCODONE (Oxy IR/ROXICODONE) immediate release tablet 5 mg, 5 mg, Oral, Q4H PRN, Loletha Grayer, MD, 5 mg at 11/07/17 1849 .  sodium chloride flush (NS) 0.9 % injection 3 mL, 3 mL, Intravenous, Q12H, Wieting, Richard, MD, 3 mL at 11/07/17 2130 .  sodium chloride flush (NS) 0.9 % injection 3 mL, 3 mL, Intravenous, PRN, Loletha Grayer, MD   Physical exam:  Vitals:   11/07/17 2052 11/08/17 0822 11/08/17 0841 11/08/17 0917  BP: 137/84 139/87 (!) 140/92 (!) 140/92  Pulse: 84 80 86 87  Resp: 13 20 20 17   Temp: 98.1 F (36.7 C) 98.1 F (36.7 C) 98.2 F (36.8 C)   TempSrc: Oral Oral Oral   SpO2: 99% 99% 98% 96%  Weight:   189 lb (85.7 kg)   Height:   5\' 10"  (1.778 m)    Physical Exam  Constitutional: He is oriented to  person, place, and time and well-developed, well-nourished, and in no distress.  HENT:  Head: Normocephalic and atraumatic.  Eyes: EOM are normal. Pupils are equal, round, and reactive to light.  Sclerae icteric  Neck: Normal range of motion.  Cardiovascular: Normal rate, regular rhythm and normal heart sounds.  Pulmonary/Chest: Effort normal and breath sounds normal.  Abdominal: Soft. Bowel sounds are normal.  No palpable hepatomegaly  Neurological: He is alert and oriented to person, place, and time.  Skin: Skin is warm and dry.       CMP Latest Ref Rng & Units 11/08/2017  Glucose 65 - 99 mg/dL 95  BUN 6 - 20 mg/dL 10  Creatinine 0.61 - 1.24 mg/dL 0.93  Sodium 135 - 145 mmol/L 131(L)  Potassium 3.5 - 5.1 mmol/L 3.6  Chloride 101 - 111 mmol/L 101  CO2 22 - 32 mmol/L 24  Calcium 8.9 - 10.3 mg/dL 8.2(L)  Total Protein 6.5 - 8.1 g/dL -  Total Bilirubin 0.3 - 1.2 mg/dL -  Alkaline Phos 38 - 126 U/L -  AST 15 - 41 U/L -  ALT 17 - 63 U/L -   CBC Latest Ref Rng & Units 11/08/2017  WBC 3.8 - 10.6 K/uL 6.1  Hemoglobin 13.0 - 18.0 g/dL 13.1  Hematocrit 40.0 - 52.0 % 39.8(L)  Platelets 150 - 440 K/uL 128(L)    @IMAGES @  Ct Abdomen Pelvis W Contrast  Result Date: 11/07/2017 CLINICAL DATA:  Generalized abdominal pain with nausea. EXAM: CT ABDOMEN AND PELVIS WITH CONTRAST TECHNIQUE: Multidetector CT imaging of the abdomen and pelvis was performed using the standard protocol following bolus administration of intravenous contrast. CONTRAST:  147mL ISOVUE-300 IOPAMIDOL (ISOVUE-300) INJECTION 61% COMPARISON:  None. FINDINGS: Lower chest: Heart size upper normal. Coronary artery calcification is evident. 5 mm right lower lobe pulmonary nodule visible on image 9 series for. Subsegmental atelectasis noted both lower lobes. Hepatobiliary: 8 mm low-density lesion identified in the tip of the lateral segment left liver. Large complex 7.8 x 9.1 cm multilocular irregular cystic/necrotic lesion is  identified in the central right liver. Lesion generates mass-effect on the intrahepatic segment of the IVC and low-attenuation in the region of the expected location of the right hepatic vein is suspicious for right hepatic venous invasion extending right up to the IVC. Calcified gallstones are evident. No intra or extrahepatic biliary duct dilatation. Pancreas: No focal mass lesion. No dilatation of the main duct. No intraparenchymal cyst. No peripancreatic edema. Spleen: No splenomegaly. No focal mass lesion. Adrenals/Urinary Tract: No adrenal nodule or mass. Kidneys are unremarkable. No evidence for hydroureter. The urinary bladder appears normal for the degree of distention. Stomach/Bowel: Stomach is nondistended. No gastric wall thickening. No evidence of outlet obstruction. Duodenum is normally positioned as is the ligament of Treitz. No small bowel wall thickening. No small bowel dilatation. The terminal ileum is normal. The appendix is normal. No gross colonic mass. No colonic wall thickening. No substantial diverticular change. Vascular/Lymphatic: There is abdominal aortic atherosclerosis without aneurysm. 4.3 x 3.7 cm necrotic lymph node is identified in the hepatoduodenal ligament with other hepatoduodenal ligament lymphadenopathy. There is a filling defect in the portal vein (see image 35 series 2) consistent with thrombus that appears nonocclusive at this time although the vessel appears markedly attenuated near the bifurcation into the left and right portal veins. Reproductive: Prostate gland is markedly enlarged. Other: No intraperitoneal free fluid. Musculoskeletal: Bone windows reveal no worrisome lytic or sclerotic osseous lesions. IMPRESSION: 1. Large irregular multiloculated cystic/necrotic mass in the central liver abuts the intrahepatic IVC. The lesion is in the region of the right hepatic vein which is either markedly attenuated or potentially invaded. This is associated with necrotic  lymphadenopathy in the porta hepatis/hepatoduodenal ligament and thrombus within the portal vein. Imaging features may be related to metastatic disease although hepatocellular carcinoma would also be a consideration especially if the lesion has invaded the right hepatic vein. Given the lymphadenopathy in the hepatoduodenal ligament, hepatic abscess is considered less likely. MRI of the abdomen without and with contrast would likely prove helpful to further evaluate. 2. 8 mm low-density lesion lateral segment left liver, indeterminate. This would be better assessed by MRI. 3.  Aortic Atherosclerois (ICD10-170.0) 4. Prostatomegaly. 5. Electronically Signed   By: Misty Stanley M.D.   On: 11/07/2017 15:49   Mr Liver W Wo Contrast  Result Date: 11/08/2017 CLINICAL DATA:  Liver masses and porta hepatis adenopathy on CT. No primary malignancy history submitted. EXAM:  MRI ABDOMEN WITHOUT AND WITH CONTRAST TECHNIQUE: Multiplanar multisequence MR imaging of the abdomen was performed both before and after the administration of intravenous contrast. CONTRAST:  31mL MULTIHANCE GADOBENATE DIMEGLUMINE 529 MG/ML IV SOLN COMPARISON:  CT of 11/07/2017. Ultrasounds including back to 07/22/2017. FINDINGS: Lower chest: Borderline cardiomegaly, without pericardial or pleural effusion. Hepatobiliary: Caudate lobe enlargement, without convincing evidence of cirrhosis. Suspicious lateral segment left liver lobe 9 mm lesion on image 38/series 13. Dominant mass centered within the central high right hepatic lobe. Segments 7 and 8. Example 8.9 x 7.5 cm on image 10/series 4. There is extension medially and superiorly along the course of the right hepatic vein, which is involved with tumor. Presumed tumor thrombus extends into the IVC, including image 20/series 13. The middle hepatic vein is displaced and likely also involved with tumor on image 23/series 13. Tumor tracks along and presumably involves both portal vein branches, including on  series 13. Multiple gallstones. Gallbladder wall thickening at 6 mm on image 20/series 4. No pericholecystic fluid identified. Borderline to mild intrahepatic biliary duct dilatation. The common duct is normal in caliber. Pancreas:  Normal, without mass or ductal dilatation. Spleen:  Normal in size, without focal abnormality. Adrenals/Urinary Tract: Normal adrenal glands. Normal kidneys, without hydronephrosis. Stomach/Bowel: Normal stomach and abdominal bowel loops. Vascular/Lymphatic: Aortic and branch vessel atherosclerosis. Branch portal vein thrombus, as detailed above. The main portal vein has thrombus within on image 50/series 13. Necrotic adenopathy in the porta hepatis, including 3.9 x 3.4 cm on image 16/series 4. Other:  No ascites. Musculoskeletal: Burtis Junes a hemangioma within the right-side of the L1 vertebral body. IMPRESSION: 1. Dominant high right hepatic lobe mass with hepatic and portal vein involvement as detailed above. These characteristics favor hepatocellular carcinoma. Isolated metastatic disease, including from colon cancer, could look similar but is felt much less likely. 2. Suspicious lateral segment left liver lobe lesion, likely metastatic disease. 3. Nodal metastasis within the porta hepatis. 4. Borderline to mild intrahepatic biliary duct dilatation, likely secondary to mass effect from the porta hepatis adenopathy. 5. Cholelithiasis with nonspecific gallbladder wall thickening. Electronically Signed   By: Abigail Miyamoto M.D.   On: 11/08/2017 09:07   US Abdomen Limited Ruq  Result Date: 11/07/2017 CLINICAL DATA:  Right upper quadrant pain EXAM: ULTRASOUND ABDOMEN LIMITED RIGHT UPPER QUADRANT COMPARISON:  07/22/2017 FINDINGS: Gallbladder: Gallbladder is well distended with multiple gallstones within. No wall thickening or pericholecystic fluid is noted. Common bile duct: Diameter: 4.7 mm Liver: There has been change in the interval from the prior exam. The portal vein is now occluded  with thrombus. Additionally there is a diffuse heterogeneous area of decreased attenuation within the right lobe of the liver. This has progressed significantly in the interval from the prior exam. It now measures 7 cm in greatest dimension while previously measuring approximately 2.7 cm. A portion of this may be related to the occlusion of the portal vein although the possibility of a progressive mass lesion could not be totally excluded. Additionally an area of decreased echogenicity is noted within the left lobe of the liver which was not seen on the prior exam measuring 1.2 cm in greatest dimension. IMPRESSION: Stable cholelithiasis without significant complicating factors. 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 There are multiple areas of abnormal echogenicity within the liver which have increased in the interval from the prior exam. Although a portion of this may be related to the portal vein occlusion, evolving mass lesions are suspected. Further evaluation by  means of CT of the abdomen and pelvis with contrast is recommended. These results were called by telephone at the time of interpretation on 11/07/2017 at 2:07 pm to Dr. Larae Grooms , who verbally acknowledged these results. Electronically Signed   By: Inez Catalina M.D.   On: 11/07/2017 14:08    Assessment and plan- Patient is a 61 y.o. male admitted for abdominal pain found to have large right liver mass along with tumor thrombus in the portal vein and nodal metastases concerning for hepatocellular carcinoma  I discussed the results of the CT scan and MRI of the liver with the patient in detail.  Patient has a large dominant right hepatic lobe mass along with tumor thrombus in the portal vein, suspicious left liver lobe lesion as well as nodal metastases.  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  There is no significant underlying cirrhosis of the liver noted and therefore patient is undergone a liver biopsy although  MRI characteristics are highly suggestive of [place]. recommend ct thorax without contrast to complete staging work up  At this time I will follow-up with the patient 1 week from now after his pathology results are back.  Given that he has portal vein involvement and nodal metastases he is not a candidate for resection or transplantation.  I will discuss his case at the tumor board to see if there would be any role for liver directed therapy down the line if he has good response to systemic treatment  If pathology comes back as hepatocellular carcinoma but I would recommend lenvatinib 12 mg p.o. daily for him and I will discuss this with him in greater detail as an outpatient.  I will work on Government social research officer for the same.  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 Hepatitis testing has been ordered and is currently pending.  AFP is pending  His LFTs do show significantly elevated bilirubin of 4.6 and alkaline phosphatase of 348 as compared to 1.7 and 134 from values in August 2018.  I suspect this is partly secondary to obstruction by the large right liver mass.  AST is elevated at 123 and ALT is 54.  His PT/INR is normal.  Thank you for this kind referral and the opportunity to participate in the care of this patient   Visit Diagnosis 1. Right upper quadrant abdominal pain   2. RUQ pain   3. Liver lesion   4. Portal vein thrombosis   5. Liver mass   6. Abdominal complaints     Dr. Randa Evens, MD, MPH [facility] Pager- [phone] 11/08/2017 3:33 PM

## 2017-11-08 NOTE — Care Management (Signed)
Met with patient briefly to assess PCP/Medication assistance as patient does not have health insurance. I have provided patient with application to [facility] and [facility]. He states he is from home alone and lives in [place]. He states he does not have a PCP and does not have money to pay for his medications.  Referral made to both [facility] and [facility].

## 2017-11-09 ENCOUNTER — Other Ambulatory Visit: Payer: Self-pay | Admitting: Pathology

## 2017-11-09 DIAGNOSIS — B1920 Unspecified viral hepatitis C without hepatic coma: Secondary | ICD-10-CM

## 2017-11-09 HISTORY — DX: Unspecified viral hepatitis C without hepatic coma: B19.20

## 2017-11-09 LAB — BASIC METABOLIC PANEL
ANION GAP: 5 (ref 5–15)
BUN: 11 mg/dL (ref 6–20)
CALCIUM: 8.5 mg/dL — AB (ref 8.9–10.3)
CO2: 28 mmol/L (ref 22–32)
Chloride: 100 mmol/L — ABNORMAL LOW (ref 101–111)
Creatinine, Ser: 0.96 mg/dL (ref 0.61–1.24)
Glucose, Bld: 96 mg/dL (ref 65–99)
POTASSIUM: 4.1 mmol/L (ref 3.5–5.1)
Sodium: 133 mmol/L — ABNORMAL LOW (ref 135–145)

## 2017-11-09 LAB — HEPATITIS B CORE ANTIBODY, TOTAL: HEP B C TOTAL AB: NEGATIVE

## 2017-11-09 LAB — SURGICAL PATHOLOGY

## 2017-11-09 LAB — HEPATITIS B SURFACE ANTIGEN: Hepatitis B Surface Ag: NEGATIVE

## 2017-11-09 LAB — HEPATITIS C ANTIBODY: HCV Ab: >11 s/co ratio — ABNORMAL HIGH (ref 0.0–0.9)

## 2017-11-09 LAB — CEA: CEA: 8.6 ng/mL — ABNORMAL HIGH (ref 0.0–4.7)

## 2017-11-09 LAB — HEPATITIS B SURFACE ANTIBODY, QUANTITATIVE

## 2017-11-09 LAB — HIV ANTIBODY (ROUTINE TESTING W REFLEX): HIV Screen 4th Generation wRfx: NONREACTIVE

## 2017-11-09 LAB — AFP TUMOR MARKER
AFP, SERUM, TUMOR MARKER: 1374 ng/mL — AB (ref 0.0–8.3)
AFP, SERUM, TUMOR MARKER: 1171 ng/mL — AB (ref 0.0–8.3)

## 2017-11-09 MED ORDER — LENVATINIB (12 MG DAILY DOSE) 3 X 4 MG PO CPPK
12.0000 mg | ORAL_CAPSULE | Freq: Every day | ORAL | 3 refills | Status: DC
Start: 2017-11-09 — End: 2018-01-05

## 2017-11-09 MED ORDER — OXYCODONE HCL 5 MG PO TABS: 5.0000 mg | ORAL_TABLET | Freq: Four times a day (QID) | ORAL | 0 refills | Status: DC | PRN

## 2017-11-09 MED ORDER — AMLODIPINE BESYLATE 5 MG PO TABS: 5.0000 mg | ORAL_TABLET | Freq: Every day | ORAL | 2 refills | Status: AC

## 2017-11-09 NOTE — Plan of Care (Signed)
MD making rounds. Order received to discharge home. IV removed. Discharge paperwork provided to patient. Prescriptions given to patient. Instructed to f/u with Medication Management as instructed by Case Management. Discharged via w/c by axillary staff.

## 2017-11-09 NOTE — Discharge Summary (Signed)
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 Carpal tunnel syndrome   . Gallstones   . Gout   . Hypertension     HOSPITAL COURSE:   61 year old male with past medical history significant for hypertension and gout presents to hospital secondary to months history of right upper quadrant abdominal pain and CT confirmed a large liver mass  #1 Liver mass- large mass in the right lobe of the liver possibly invading hepatic vein and portal vein thrombosis -Status post liver biopsy.  -CEA slightly elevated but alpha-fetoprotein is significantly elevated. Also hepatitis C antibody B surface antibody is positive. Likely had prior exposure. 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 CT of the chest showing pulmonary nodules as well. Could be stage IV disease -Appreciate oncology consult. Not a surgical candidate since it seems to be already extending into the hepatic vein - No indication for anticoagulation due to tumor mass extending into the hepatic vein and not thrombus. -When necessary pain medications. -MRI of the liver reviewed and confirms the mass - being started on Lenvatinib by oncology oral daily for now- outpatient f/u next week  #2 hypertension-on low-dose hydrochlorothiazide, discontinued due to sodium trending on the lower side Added norvasc with good BP control.  Patient is otherwise independent. Will be  discharged home today     DISCHARGE CONDITIONS:   Guarded  CONSULTS OBTAINED:   Treatment Team:  Sindy Guadeloupe, MD  DRUG ALLERGIES:   No Known Allergies DISCHARGE MEDICATIONS:   Allergies as of 11/09/2017   No Known Allergies     Medication List    STOP taking these medications   aspirin EC 81 MG tablet   hydrochlorothiazide 12.5 MG tablet Commonly known as:  HYDRODIURIL   naproxen 500 MG tablet Commonly known as:  NAPROSYN     TAKE these medications   amLODipine 5 MG tablet Commonly known as:  NORVASC Take 1 tablet (5 mg total) by mouth daily.   Lenvatinib 12 MG Daily Dose 4 (3) MG Cppk Take 12 mg by mouth daily.   oxyCODONE 5 MG immediate release tablet Commonly known as:  Oxy IR/ROXICODONE Take 1 tablet (5 mg total) by mouth every 6 (six) hours as needed for moderate pain, severe pain or breakthrough pain.        DISCHARGE INSTRUCTIONS:   1. Oncology follow up within 1 week  DIET:   Cardiac diet  ACTIVITY:   Activity as tolerated  OXYGEN:   Home Oxygen: No.  Oxygen Delivery: room air  DISCHARGE LOCATION:   home   If you experience worsening of your admission symptoms, develop shortness of breath, life threatening emergency, suicidal or homicidal thoughts you must seek medical attention immediately by calling 911 or calling your MD immediately  if symptoms less severe.  You Must read complete instructions/literature along with all the possible adverse reactions/side effects for all the Medicines you take and that have been  prescribed to you. Take any new Medicines after you have completely understood and accpet all the possible adverse reactions/side effects.   Please note  You were cared for by a hospitalist during your hospital stay. If you have any questions about your discharge medications or the care you received while you were in the hospital after you are discharged, you can call the unit and asked to speak with the hospitalist  on call if the hospitalist that took care of you is not available. Once you are discharged, your primary care physician will handle any further medical issues. Please note that NO REFILLS for any discharge medications will be authorized once you are discharged, as it is imperative that you return to your primary care physician (or establish a relationship with a primary care physician if you do not have one) for your aftercare needs so that they can reassess your need for medications and monitor your lab values.    On the day of Discharge:  VITAL SIGNS:   Blood pressure 122/83, pulse 81, temperature 98.6 F (37 C), temperature source Oral, resp. rate 13, height 5\' 10"  (1.778 m), weight 85.7 kg (189 lb), SpO2 96 %.  PHYSICAL EXAMINATION:    GENERAL:  61 y.o.-year-old patient lying in the bed with no acute distress.  EYES: Pupils equal, round, reactive to light and accommodation. No scleral icterus. Extraocular muscles intact.  HEENT: Head atraumatic, normocephalic. Oropharynx and nasopharynx clear.  NECK:  Supple, no jugular venous distention. No thyroid enlargement, no tenderness.  LUNGS: Normal breath sounds bilaterally, no wheezing, rales,rhonchi or crepitation. No use of accessory muscles of respiration.  CARDIOVASCULAR: S1, S2 normal. No murmurs, rubs, or gallops.  ABDOMEN: Soft, some discomfort in the right upper quadrant, dressing in place from the liver biopsy. nontender, nondistended. Bowel sounds present. No organomegaly or mass.  EXTREMITIES: No pedal edema, cyanosis, or clubbing.  NEUROLOGIC: Cranial nerves II through XII are intact. Muscle strength 5/5 in all extremities. Sensation intact. Gait not checked.  PSYCHIATRIC: The patient is alert and oriented x 3.  SKIN: No obvious rash, lesion, or ulcer.     DATA REVIEW:   CBC Recent Labs  Lab 11/08/17 0410  WBC 6.1  HGB 13.1  HCT 39.8*  PLT 128*    Chemistries  Recent Labs  Lab 11/07/17 1048  11/09/17 0413  NA 136    < > 133*  K 3.9   < > 4.1  CL 102   < > 100*  CO2 27   < > 28  GLUCOSE 109*   < > 96  BUN 10   < > 11  CREATININE 0.87   < > 0.96  CALCIUM 9.0   < > 8.5*  AST 123*  --   --   ALT 54  --   --   ALKPHOS 348*  --   --   BILITOT 4.6*  --   --    < > = values in this interval not displayed.     Microbiology Results  Results for orders placed or performed during the hospital encounter of 11/07/17  Culture, blood (routine x 2)     Status: None (Preliminary result)   Collection Time: 11/07/17  5:27 PM  Result Value Ref Range Status   Specimen Description BLOOD LEFT ANTECUBITAL  Final   Special Requests   Final    BOTTLES DRAWN AEROBIC AND ANAEROBIC Blood Culture adequate volume   Culture NO GROWTH 2 DAYS  Final   Report  Status PENDING  Incomplete  Culture, blood (routine x 2)     Status: None (Preliminary result)   Collection Time: 11/07/17  5:28 PM  Result Value Ref Range Status   Specimen Description BLOOD RIGHT ANTECUBITAL  Final   Special Requests   Final    BOTTLES DRAWN AEROBIC AND ANAEROBIC Blood Culture adequate volume   Culture NO GROWTH 2 DAYS  Final   Report Status PENDING  Incomplete    RADIOLOGY:  Ct Chest Wo Contrast  Result Date: 11/09/2017 CLINICAL DATA:  Recent diagnosis of liver cancer. Concern for metastatic disease. EXAM: CT CHEST WITHOUT CONTRAST TECHNIQUE: Multidetector CT imaging of the chest was performed following the standard protocol without IV contrast. COMPARISON:  CT abdomen and pelvis 11/07/2017. MRI abdomen 11/08/2017. FINDINGS: Cardiovascular: Normal heart size. No pericardial effusion. Coronary artery calcifications. Normal caliber thoracic aorta with scattered calcification. Mediastinum/Nodes: Esophagus is decompressed. No significant lymphadenopathy in the chest. Scattered mediastinal lymph nodes are not pathologically enlarged. Lungs/Pleura: Evaluation is limited due to motion artifact. Atelectasis in the lung bases. Multiple noncalcified nodules  are demonstrated throughout both lungs. Some of the nodules have somewhat irregular margins. Probably the largest nodule is in the right apex measuring 6 mm in diameter. No pleural effusions. No pneumothorax. Airways are patent. Upper Abdomen: A large liver mass demonstrated on previous CT and MR studies is not well appreciated on noncontrast imaging but there is heterogeneity of the liver and small intraparenchymal gas likely corresponding to the tumor. Lymphadenopathy is demonstrated in the porta hepatis. Musculoskeletal: No destructive bone lesions. IMPRESSION: 1. Multiple bilateral pulmonary nodules measuring up to 6 mm in diameter. Given the history of malignancy, metastasis is probable although postinflammatory changes could also have this appearance. Lesions are too small to be reliably characterize on PET-CT. Consider three-month follow-up CT study. 2. Large liver mass better defined on previous CT and MR studies. Metastatic lymph nodes in the upper abdomen. 3. Coronary artery calcifications. Electronically Signed   By: Lucienne Capers M.D.   On: 11/09/2017 01:04     Management plans discussed with the patient, family and they are in agreement.  CODE STATUS:     Code Status Orders  (From admission, onward)        Start     Ordered   11/07/17 1713  Full code  Continuous     11/07/17 1713    Code Status History    Date Active Date Inactive Code Status Order ID Comments User Context   This patient has a current code status but no historical code status.      TOTAL TIME TAKING CARE OF THIS PATIENT: 38 minutes.    Gladstone Lighter M.D on 11/09/2017 at 11:06 AM  Between 7am to 6pm - Pager - [phone]  After 6pm go to [email or web address] - Proofreader  [facility]  Office  [phone]  CC: Primary care physician; Patient, No Pcp Per   Note: This dictation was prepared with Dragon dictation along with smaller phrase technology. Any  transcriptional errors that result from this process are unintentional.

## 2017-11-09 NOTE — Care Management (Signed)
Discharge to home today per Dr. Tressia Miners. Reminded to take prescriptions to [facility]. Will update Lurena Nida at [facility]  Family/friend will transport [name] RN MSN [facility] [phone]

## 2017-11-09 NOTE — Discharge Instructions (Addendum)

## 2017-11-09 NOTE — Telephone Encounter (Signed)
Oral Oncology Patient Advocate Encounter  Met patient in hospital  room to complete application for [employer] in an effort to reduce patient's out of pocket expense for Lenvima to $0.    Application completed and faxed to [phone].   Patient assistance phone number for follow up is [phone].   This encounter will be updated until final determination.   [place] Patient Advocate [phone] 11/09/2017 8:50 AM

## 2017-11-12 LAB — HCV RNA QUANT
HCV QUANT LOG: 6.305 {Log_IU}/mL (ref >1.70)
HCV Quantitative: 2020000 IU/mL (ref >50)

## 2017-11-12 LAB — CULTURE, BLOOD (ROUTINE X 2)
CULTURE: NO GROWTH
Culture: NO GROWTH
SPECIAL REQUESTS: ADEQUATE
SPECIAL REQUESTS: ADEQUATE

## 2017-11-16 ENCOUNTER — Inpatient Hospital Stay: Payer: Medicaid Other

## 2017-11-16 ENCOUNTER — Inpatient Hospital Stay: Payer: Medicaid Other | Attending: Oncology | Admitting: Oncology

## 2017-11-16 ENCOUNTER — Other Ambulatory Visit: Payer: Self-pay

## 2017-11-16 ENCOUNTER — Telehealth: Payer: Self-pay | Admitting: Oncology

## 2017-11-16 ENCOUNTER — Encounter: Payer: Self-pay | Admitting: Oncology

## 2017-11-16 ENCOUNTER — Telehealth: Payer: Self-pay | Admitting: Pharmacist

## 2017-11-16 VITALS — BP 145/98 | HR 114 | Temp 99.8°F | Resp 14 | Wt 174.0 lb

## 2017-11-16 DIAGNOSIS — C220 Liver cell carcinoma: Secondary | ICD-10-CM | POA: Diagnosis not present

## 2017-11-16 DIAGNOSIS — E878 Other disorders of electrolyte and fluid balance, not elsewhere classified: Secondary | ICD-10-CM | POA: Insufficient documentation

## 2017-11-16 DIAGNOSIS — R5381 Other malaise: Secondary | ICD-10-CM | POA: Diagnosis not present

## 2017-11-16 DIAGNOSIS — R5383 Other fatigue: Secondary | ICD-10-CM | POA: Diagnosis not present

## 2017-11-16 DIAGNOSIS — I10 Essential (primary) hypertension: Secondary | ICD-10-CM

## 2017-11-16 DIAGNOSIS — R634 Abnormal weight loss: Secondary | ICD-10-CM | POA: Diagnosis not present

## 2017-11-16 DIAGNOSIS — R34 Anuria and oliguria: Secondary | ICD-10-CM

## 2017-11-16 DIAGNOSIS — I81 Portal vein thrombosis: Secondary | ICD-10-CM | POA: Insufficient documentation

## 2017-11-16 DIAGNOSIS — B1920 Unspecified viral hepatitis C without hepatic coma: Secondary | ICD-10-CM | POA: Insufficient documentation

## 2017-11-16 DIAGNOSIS — I2510 Atherosclerotic heart disease of native coronary artery without angina pectoris: Secondary | ICD-10-CM | POA: Diagnosis not present

## 2017-11-16 DIAGNOSIS — N400 Enlarged prostate without lower urinary tract symptoms: Secondary | ICD-10-CM | POA: Insufficient documentation

## 2017-11-16 DIAGNOSIS — Z7189 Other specified counseling: Secondary | ICD-10-CM

## 2017-11-16 DIAGNOSIS — E871 Hypo-osmolality and hyponatremia: Secondary | ICD-10-CM | POA: Insufficient documentation

## 2017-11-16 DIAGNOSIS — Z79899 Other long term (current) drug therapy: Secondary | ICD-10-CM

## 2017-11-16 LAB — PROTEIN, URINE, RANDOM: Total Protein, Urine: 41 mg/dL

## 2017-11-16 LAB — COMPREHENSIVE METABOLIC PANEL
ALBUMIN: 2.6 g/dL — AB (ref 3.5–5.0)
ALK PHOS: 275 U/L — AB (ref 38–126)
ALT: 32 U/L (ref 17–63)
ANION GAP: 10 (ref 5–15)
AST: 67 U/L — ABNORMAL HIGH (ref 15–41)
BUN: 13 mg/dL (ref 6–20)
CALCIUM: 8.7 mg/dL — AB (ref 8.9–10.3)
CO2: 22 mmol/L (ref 22–32)
CREATININE: 1 mg/dL (ref 0.61–1.24)
Chloride: 94 mmol/L — ABNORMAL LOW (ref 101–111)
GFR calc Af Amer: >60 mL/min (ref >60)
GFR calc non Af Amer: >60 mL/min (ref >60)
GLUCOSE: 136 mg/dL — AB (ref 65–99)
Potassium: 3.6 mmol/L (ref 3.5–5.1)
SODIUM: 126 mmol/L — AB (ref 135–145)
Total Bilirubin: 6.4 mg/dL — ABNORMAL HIGH (ref 0.3–1.2)
Total Protein: 9.3 g/dL — ABNORMAL HIGH (ref 6.5–8.1)

## 2017-11-16 LAB — PHOSPHORUS: Phosphorus: 3.3 mg/dL (ref 2.5–4.6)

## 2017-11-16 LAB — MAGNESIUM: Magnesium: 1.8 mg/dL (ref 1.7–2.4)

## 2017-11-16 NOTE — Telephone Encounter (Signed)
Oral Oncology Patient Advocate Encounter  Received notification from [facility] Patient Assistance program that patient has been successfully enrolled into their program to receive Lenvima from the manufacturer at $0 out of pocket until 12/20/2017.   I called and spoke with patient.  He knows we will have to re-apply.   Patient knows to call the office with questions or concerns.  Oral Oncology Clinic will continue to follow.   [place] Patient Advocate [phone] 11/16/2017 10:28 AM

## 2017-11-16 NOTE — Telephone Encounter (Signed)
Oral Chemotherapy Pharmacist Encounter  Mr. Rudnick' Michel Santee will be delivered on 11/29 or 11/30 per the manufacturer. He knows to get started on the medication as soon as he receives it.  Darl Pikes, PharmD, BCPS Hematology/Oncology Clinical Pharmacist [facility] [phone]  11/16/2017 1:09 PM

## 2017-11-16 NOTE — Progress Notes (Signed)
Patient here today for hospital follow up for liver mass. He reports sharp pain in his left side when he takes a deep breath or coughs.

## 2017-11-16 NOTE — Telephone Encounter (Signed)
Oral Oncology Patient Advocate Encounter  Did re-enrollment for patients Lenvima with [place]. Sent letter also.  Will faxed 11/20/2017 [phone] Phone   [place] Patient Advocate [phone] 11/16/2017 10:38 AM

## 2017-11-16 NOTE — Progress Notes (Signed)
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  Multiple areas of abnormal echogenicity within the liver which have increased since prior exam.  Evolving mass lesions are suspected.  This was followed by a CT abdomen and pelvis with contrast which showed a large irregular multiloculated necrotic mass in the central liver measuring about 7.8 x 9.1 cm.  4.3 x 3.7 cm necrotic lymph node in the hepatoduodenal ligament with other hepatoduodenal ligament lymphadenopathy.  The filling defect in the portal vein which I reviewed with radiology and consistent with tumor thrombus.  This was followed by MRI of the liver with and without contrast which showed a dominant high right hepatic lobe mass with hepatic and portal vein involvement.  Characteristics favor hepatocellular carcinoma.  Isolated metastatic disease including from colon cancer can look familiar but felt less likely.  Suspicious lateral segment left liver lobe lesion likely metastatic disease.  Nodal metastases within the porta hepatis.  The patient does not have any known chronic liver disease.  No significant cirrhosis was noted on current imaging.  HIV and hepatitis B testing was negative.  Patient was tested positive for  hepatitis C with high viral load.  AFP was also elevated at 1374  Patient reports abdominal pain is better with oxycodone. He drinks 24 oz of beer daily and has been doing so for many years. He lives alone in [place]. His uncle and kids live close by  Given that there was no underlying cirrhosis of the liver noted, patient underwent liver biopsy which was consistent with hepatocellular carcinoma.  CT chest without contrast showed subcentimeter lung nodules of undetermined etiology    Interval history-patient reports that his abdominal pain is currently well controlled.  Reports ongoing fatigue and weight loss.  He lives alone and is independent of his ADLs and IADLs.  ECOG PS- 1 Pain scale- 0 Opioid associated constipation- no  Review of systems- Review of Systems  Constitutional: Positive for malaise/fatigue and weight loss. Negative for chills and fever.  HENT: Negative for congestion, ear discharge and nosebleeds.   Eyes: Negative for blurred vision.  Respiratory: Negative for cough, hemoptysis, sputum production, shortness of breath and wheezing.   Cardiovascular: Negative for chest pain, palpitations, orthopnea and claudication.  Gastrointestinal: Negative for abdominal pain, blood in stool, constipation, diarrhea, heartburn, melena, nausea and vomiting.  Genitourinary: Negative for dysuria, flank pain, frequency, hematuria and urgency.  Musculoskeletal: Negative for back pain, joint pain and myalgias.  Skin: Negative for rash.  Neurological: Negative for dizziness, tingling, focal weakness, seizures, weakness and headaches.  Endo/Heme/Allergies: Does not bruise/bleed easily.  Psychiatric/Behavioral: Negative for depression and suicidal ideas. The patient does not have insomnia.       No Known Allergies   Past Medical History:  Diagnosis Date  . Carpal tunnel syndrome   . Gallstones   .  Gout   . Hypertension      Past Surgical History:  Procedure Laterality Date  . NO  PAST SURGERIES      Social History   Socioeconomic History  . Marital status: Single    Spouse name: Not on file  . Number of children: Not on file  . Years of education: Not on file  . Highest education level: Not on file  Social Needs  . Financial resource strain: Not on file  . Food insecurity - worry: Not on file  . Food insecurity - inability: Not on file  . Transportation needs - medical: Not on file  . Transportation needs - non-medical: Not on file  Occupational History  . Not on file  Tobacco Use  . Smoking status: Light Tobacco Smoker  . Smokeless tobacco: Never Used  Substance and Sexual Activity  . Alcohol use: Yes    Comment: 2-24 oz beer 4X per week  . Drug use: No  . Sexual activity: Not on file  Other Topics Concern  . Not on file  Social History Narrative  . Not on file    Family History  Problem Relation Age of Onset  . Cancer Mother   . CVA Father      Current Outpatient Medications:  .  amLODipine (NORVASC) 5 MG tablet, Take 1 tablet (5 mg total) by mouth daily., Disp: 30 tablet, Rfl: 2 .  oxyCODONE (OXY IR/ROXICODONE) 5 MG immediate release tablet, Take 1 tablet (5 mg total) by mouth every 6 (six) hours as needed for moderate pain, severe pain or breakthrough pain., Disp: 20 tablet, Rfl: 0 .  Lenvatinib 12 MG Daily Dose 4 (3) MG CPPK, Take 12 mg by mouth daily. (Patient not taking: Reported on 11/16/2017), Disp: 90 each, Rfl: 3  Physical exam:  Vitals:   11/16/17 1112  BP: (!) 145/98  Pulse: (!) 114  Resp: 14  Temp: 99.8 F (37.7 C)  TempSrc: Tympanic  Weight: 174 lb (78.9 kg)   Physical Exam  Constitutional: He is oriented to person, place, and time and well-developed, well-nourished, and in no distress.  HENT:  Head: Normocephalic and atraumatic.  Eyes: EOM are normal. Pupils are equal, round, and reactive to light.  Sclerae icteric  Neck: Normal range of motion.  Cardiovascular: Regular rhythm and normal heart sounds.    Tachycardic  Pulmonary/Chest: Effort normal and breath sounds normal.  Abdominal: Soft. Bowel sounds are normal.  Mild palpable hepatomegaly  Neurological: He is alert and oriented to person, place, and time.  Skin: Skin is warm and dry.     CMP Latest Ref Rng & Units 11/16/2017  Glucose 65 - 99 mg/dL 136(H)  BUN 6 - 20 mg/dL 13  Creatinine 0.61 - 1.24 mg/dL 1.00  Sodium 135 - 145 mmol/L 126(L)  Potassium 3.5 - 5.1 mmol/L 3.6  Chloride 101 - 111 mmol/L 94(L)  CO2 22 - 32 mmol/L 22  Calcium 8.9 - 10.3 mg/dL 8.7(L)  Total Protein 6.5 - 8.1 g/dL 9.3(H)  Total Bilirubin 0.3 - 1.2 mg/dL 6.4(H)  Alkaline Phos 38 - 126 U/L 275(H)  AST 15 - 41 U/L 67(H)  ALT 17 - 63 U/L 32   CBC Latest Ref Rng & Units 11/08/2017  WBC 3.8 - 10.6 K/uL 6.1  Hemoglobin 13.0 - 18.0 g/dL 13.1  Hematocrit 40.0 - 52.0 % 39.8(L)  Platelets 150 - 440 K/uL 128(L)    No images are attached to the encounter.  Ct Chest Wo Contrast  Result Date: 11/09/2017 CLINICAL DATA:  Recent diagnosis of liver cancer. Concern for metastatic disease. EXAM: CT CHEST WITHOUT CONTRAST TECHNIQUE: Multidetector CT imaging of the chest was performed following the standard protocol without IV contrast. COMPARISON:  CT abdomen and pelvis 11/07/2017. MRI abdomen 11/08/2017. FINDINGS: Cardiovascular: Normal heart size. No pericardial effusion. Coronary artery calcifications. Normal caliber thoracic aorta with scattered calcification. Mediastinum/Nodes: Esophagus is decompressed. No significant lymphadenopathy in the chest. Scattered mediastinal lymph nodes are not pathologically enlarged. Lungs/Pleura: Evaluation is limited due to motion artifact. Atelectasis in the lung bases. Multiple noncalcified nodules are demonstrated throughout both lungs. Some of the nodules have somewhat irregular margins. Probably the largest nodule is in the right apex measuring 6 mm in diameter. No pleural effusions. No pneumothorax. Airways are patent. Upper  Abdomen: A large liver mass demonstrated on previous CT and MR studies is not well appreciated on noncontrast imaging but there is heterogeneity of the liver and small intraparenchymal gas likely corresponding to the tumor. Lymphadenopathy is demonstrated in the porta hepatis. Musculoskeletal: No destructive bone lesions. IMPRESSION: 1. Multiple bilateral pulmonary nodules measuring up to 6 mm in diameter. Given the history of malignancy, metastasis is probable although postinflammatory changes could also have this appearance. Lesions are too small to be reliably characterize on PET-CT. Consider three-month follow-up CT study. 2. Large liver mass better defined on previous CT and MR studies. Metastatic lymph nodes in the upper abdomen. 3. Coronary artery calcifications. Electronically Signed   By: Lucienne Capers M.D.   On: 11/09/2017 01:04   Ct Abdomen Pelvis W Contrast  Result Date: 11/07/2017 CLINICAL DATA:  Generalized abdominal pain with nausea. EXAM: CT ABDOMEN AND PELVIS WITH CONTRAST TECHNIQUE: Multidetector CT imaging of the abdomen and pelvis was performed using the standard protocol following bolus administration of intravenous contrast. CONTRAST:  175mL ISOVUE-300 IOPAMIDOL (ISOVUE-300) INJECTION 61% COMPARISON:  None. FINDINGS: Lower chest: Heart size upper normal. Coronary artery calcification is evident. 5 mm right lower lobe pulmonary nodule visible on image 9 series for. Subsegmental atelectasis noted both lower lobes. Hepatobiliary: 8 mm low-density lesion identified in the tip of the lateral segment left liver. Large complex 7.8 x 9.1 cm multilocular irregular cystic/necrotic lesion is identified in the central right liver. Lesion generates mass-effect on the intrahepatic segment of the IVC and low-attenuation in the region of the expected location of the right hepatic vein is suspicious for right hepatic venous invasion extending right up to the IVC. Calcified gallstones are evident. No  intra or extrahepatic biliary duct dilatation. Pancreas: No focal mass lesion. No dilatation of the main duct. No intraparenchymal cyst. No peripancreatic edema. Spleen: No splenomegaly. No focal mass lesion. Adrenals/Urinary Tract: No adrenal nodule or mass. Kidneys are unremarkable. No evidence for hydroureter. The urinary bladder appears normal for the degree of distention. Stomach/Bowel: Stomach is nondistended. No gastric wall thickening. No evidence of outlet obstruction. Duodenum is normally positioned as is the ligament of Treitz. No small bowel wall thickening. No small bowel dilatation. The terminal ileum is normal. The appendix is normal. No gross colonic mass. No colonic wall thickening. No substantial diverticular change. Vascular/Lymphatic: There is abdominal aortic atherosclerosis without aneurysm. 4.3 x 3.7 cm necrotic lymph node is identified in the hepatoduodenal ligament with other hepatoduodenal ligament lymphadenopathy. There is a filling defect in the portal vein (see image 35 series 2) consistent with thrombus that appears nonocclusive at this time although the vessel appears markedly attenuated near the bifurcation into the left and right portal veins. Reproductive:  Prostate gland is markedly enlarged. Other: No intraperitoneal free fluid. Musculoskeletal: Bone windows reveal no worrisome lytic or sclerotic osseous lesions. IMPRESSION: 1. Large irregular multiloculated cystic/necrotic mass in the central liver abuts the intrahepatic IVC. The lesion is in the region of the right hepatic vein which is either markedly attenuated or potentially invaded. This is associated with necrotic lymphadenopathy in the porta hepatis/hepatoduodenal ligament and thrombus within the portal vein. Imaging features may be related to metastatic disease although hepatocellular carcinoma would also be a consideration especially if the lesion has invaded the right hepatic vein. Given the lymphadenopathy in the  hepatoduodenal ligament, hepatic abscess is considered less likely. MRI of the abdomen without and with contrast would likely prove helpful to further evaluate. 2. 8 mm low-density lesion lateral segment left liver, indeterminate. This would be better assessed by MRI. 3.  Aortic Atherosclerois (ICD10-170.0) 4. Prostatomegaly. 5. Electronically Signed   By: Misty Stanley M.D.   On: 11/07/2017 15:49   Mr Liver W Wo Contrast  Result Date: 11/08/2017 CLINICAL DATA:  Liver masses and porta hepatis adenopathy on CT. No primary malignancy history submitted. EXAM: MRI ABDOMEN WITHOUT AND WITH CONTRAST TECHNIQUE: Multiplanar multisequence MR imaging of the abdomen was performed both before and after the administration of intravenous contrast. CONTRAST:  47mL MULTIHANCE GADOBENATE DIMEGLUMINE 529 MG/ML IV SOLN COMPARISON:  CT of 11/07/2017. Ultrasounds including back to 07/22/2017. FINDINGS: Lower chest: Borderline cardiomegaly, without pericardial or pleural effusion. Hepatobiliary: Caudate lobe enlargement, without convincing evidence of cirrhosis. Suspicious lateral segment left liver lobe 9 mm lesion on image 38/series 13. Dominant mass centered within the central high right hepatic lobe. Segments 7 and 8. Example 8.9 x 7.5 cm on image 10/series 4. There is extension medially and superiorly along the course of the right hepatic vein, which is involved with tumor. Presumed tumor thrombus extends into the IVC, including image 20/series 13. The middle hepatic vein is displaced and likely also involved with tumor on image 23/series 13. Tumor tracks along and presumably involves both portal vein branches, including on series 13. Multiple gallstones. Gallbladder wall thickening at 6 mm on image 20/series 4. No pericholecystic fluid identified. Borderline to mild intrahepatic biliary duct dilatation. The common duct is normal in caliber. Pancreas:  Normal, without mass or ductal dilatation. Spleen:  Normal in size, without  focal abnormality. Adrenals/Urinary Tract: Normal adrenal glands. Normal kidneys, without hydronephrosis. Stomach/Bowel: Normal stomach and abdominal bowel loops. Vascular/Lymphatic: Aortic and branch vessel atherosclerosis. Branch portal vein thrombus, as detailed above. The main portal vein has thrombus within on image 50/series 13. Necrotic adenopathy in the porta hepatis, including 3.9 x 3.4 cm on image 16/series 4. Other:  No ascites. Musculoskeletal: Burtis Junes a hemangioma within the right-side of the L1 vertebral body. IMPRESSION: 1. Dominant high right hepatic lobe mass with hepatic and portal vein involvement as detailed above. These characteristics favor hepatocellular carcinoma. Isolated metastatic disease, including from colon cancer, could look similar but is felt much less likely. 2. Suspicious lateral segment left liver lobe lesion, likely metastatic disease. 3. Nodal metastasis within the porta hepatis. 4. Borderline to mild intrahepatic biliary duct dilatation, likely secondary to mass effect from the porta hepatis adenopathy. 5. Cholelithiasis with nonspecific gallbladder wall thickening. Electronically Signed   By: Abigail Miyamoto M.D.   On: 11/08/2017 09:07   US Biopsy (liver)  Result Date: 11/08/2017 CLINICAL DATA:  Right lobe hepatic mass with hepatic vein and portal vein invasion by MRI. The patient presents for biopsy. EXAM: ULTRASOUND GUIDED  CORE BIOPSY OF LIVER MASS MEDICATIONS: 2.0 mg IV Versed; 100 mcg IV Fentanyl Total Moderate Sedation Time: 10 minutes. The patient's level of consciousness and physiologic status were continuously monitored during the procedure by Radiology nursing. PROCEDURE: The procedure, risks, benefits, and alternatives were explained to the patient. Questions regarding the procedure were encouraged and answered. The patient understands and consents to the procedure. A time out was performed prior to initiating the procedure. Ultrasound was performed of the liver. The  right abdominal wall was prepped with chlorhexidine in a sterile fashion, and a sterile drape was applied covering the operative field. A sterile gown and sterile gloves were used for the procedure. Local anesthesia was provided with 1% Lidocaine. Under ultrasound guidance, a 17 gauge needle was advanced into the right lobe of the liver. Three separate coaxial 18 gauge core biopsy samples were obtained of a right lobe liver mass. Material was submitted in formalin. After the procedure Gel-Foam pledgets were advanced through the outer needle as it was retracted. COMPLICATIONS: None. FINDINGS: Large, lobulated, hypoechoic mass is present within the posterior aspect of the right lobe of the liver measuring at least 9 cm in maximum diameter. Solid tissue was obtained from the mass. IMPRESSION: Ultrasound-guided core biopsy performed of a large mass within the right lobe of the liver. Electronically Signed   By: Aletta Edouard M.D.   On: 11/08/2017 10:07   US Abdomen Limited Ruq  Result Date: 11/07/2017 CLINICAL DATA:  Right upper quadrant pain EXAM: ULTRASOUND ABDOMEN LIMITED RIGHT UPPER QUADRANT COMPARISON:  07/22/2017 FINDINGS: Gallbladder: Gallbladder is well distended with multiple gallstones within. No wall thickening or pericholecystic fluid is noted. Common bile duct: Diameter: 4.7 mm Liver: There has been change in the interval from the prior exam. The portal vein is now occluded with thrombus. Additionally there is a diffuse heterogeneous area of decreased attenuation within the right lobe of the liver. This has progressed significantly in the interval from the prior exam. It now measures 7 cm in greatest dimension while previously measuring approximately 2.7 cm. A portion of this may be related to the occlusion of the portal vein although the possibility of a progressive mass lesion could not be totally excluded. Additionally an area of decreased echogenicity is noted within the left lobe of the liver  which was not seen on the prior exam measuring 1.2 cm in greatest dimension. IMPRESSION: Stable cholelithiasis without significant complicating factors. 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 There are multiple areas of abnormal echogenicity within the liver which have increased in the interval from the prior exam. Although a portion of this may be related to the portal vein occlusion, evolving mass lesions are suspected. Further evaluation by means of CT of the abdomen and pelvis with contrast is recommended. These results were called by telephone at the time of interpretation on 11/07/2017 at 2:07 pm to Dr. Larae Grooms , who verbally acknowledged these results. Electronically Signed   By: Inez Catalina M.D.   On: 11/07/2017 14:08     Assessment and plan- Patient is a 61 y.o. male with newly diagnosed stage IV T4 N1 M0 hepatocellular carcinoma with nodal metastases likely secondary to chronic hepatitis C  I have personally reviewed CT MRI as well as CT chest images and I have discussed the findings with him in detail.  I also discussed the results of the pathology with the patient in detail.  Pathology is consistent with hepatocellular carcinoma.  Given that he has a  large liver mass along with nodal metastases as well as portal venous thrombosis he is not a candidate for liver transplantation or resection.  I do not see a role for liver directed therapy as well at this time.  I would recommend first-line treatment with lenvatinib 12 mg p.o. daily.  We have already obtained manufacturer assistance since patient does not have any insurance and he should be getting his drug within the next couple of days.  Discussed risks and benefits of lenvatinib including all but not limited to hypertension, fatigue, leg edema, skin rash, nausea vomiting and diarrhea.  We have obtain baseline EKG today which does not show any QTC prolongation.  TSH has been ordered today and is currently pending.    We will get repeat CMP in 2 weeks time and I will see him back in 4 weeks with repeat CBC, CMP, TSH and AFP.  CMP shows hyponatremia and hypochloremia which we will continue to monitor.  Clinically he does not appear dehydrated.  And this could be also seen in the setting of chronic liver disease.  His total bilirubin is elevated to 6.4 and I suspect this is secondary to obstruction from the large liver mass especially given that his alkaline phosphatase is also elevated to 275.  I do not see a role for GI intervention such as ERCP and stenting at this time.  I will review his scans again at the tumor board this week  Although patient has hepatitis C with high viral titers, I do not see a role for treatment of hepatitis C at this time given his poor prognosis from hepatocellular carcinoma.  If he has a good response to his [facility] treatment this may be considered down the line.  Patient denies any exposure to contaminated needles tattoos blood transfusions in the past  Patient will call us in the interim if he has any worsening side effects from the medication.  I explained to him that treatment would only be palliative and not curative to string the tumor and improve his quality of life and longevity.  Patient understands and agrees to proceed as planned   Total face to face encounter time for this patient visit was 45 min. >50% of the time was  spent in counseling and coordination of care.      Visit Diagnosis 1. Hepatocellular carcinoma ([place])   2. Goals of care, counseling/discussion      Dr. Randa Evens, MD, MPH [facility] Pager- [phone] 11/16/2017 1:13 PM

## 2017-11-16 NOTE — Telephone Encounter (Signed)
Oral Chemotherapy Pharmacist Encounter  I spoke with patient for overview of new oral chemotherapy medication: Lenvima (lenvatinib) for the treatment of hepatocellular carcinoma, planned duration until disease progression or toxicity.   Pt is doing well. Counseled patient on administration, dosing, side effects, monitoring, drug-food interactions, safe handling, storage, and disposal. Patient will take 12mg  (three 4mg  capsules) once daily at the same time every day; with or without food.   Side effects include but not limited to: hypertension, cardiac dysfunction, hepatotoxicity, renal failure, diarrhea, nausea/vomiting, mouth sores, peripheral edema.   Reviewed with patient importance of keeping a medication schedule and plan for any missed doses.  Tim Potter voiced understanding and appreciation. Provided him with medication handout and oral chemotherapy binder.   All questions answered.  Provided patient with [facility] phone number. Patient knows to call the office with questions or concerns. Oral Chemotherapy [facility] will continue to follow.  Candelaria Stagers, PharmD  Pharmacy Resident  [facility] [phone]  11/16/2017 12:19 PM

## 2017-11-17 LAB — THYROID PANEL WITH TSH
FREE THYROXINE INDEX: 3.6 (ref 1.2–4.9)
T3 Uptake Ratio: 29 % (ref 24–39)
T4 TOTAL: 12.4 ug/dL — AB (ref 4.5–12.0)
TSH: 3.26 u[IU]/mL (ref 0.450–4.500)

## 2017-11-18 NOTE — Telephone Encounter (Signed)
Oral Oncology Patient Advocate Encounter  Called [facility] to see if Tim Potter had been shipped? Will be shipped 11/09/2017. He is approved 11/08/2017-11/09/2018.   [place] Patient Advocate [phone] 11/18/2017 11:45 AM

## 2017-11-19 ENCOUNTER — Telehealth: Payer: Self-pay | Admitting: *Deleted

## 2017-11-19 NOTE — Telephone Encounter (Signed)
Please have him keep a tab of his BP daily. If his BP is persistently >160- we will add some meds. At this time continue to monitor

## 2017-11-19 NOTE — Telephone Encounter (Signed)
I called patient and left detailed message on his voice mail and asked that he return my call

## 2017-11-19 NOTE — Telephone Encounter (Signed)
Patient reported to [facility] that he is having problems with his B/P 147/90. He is on new medicine Linvatinib which can cause problems with HTN. Please advise if there is anything that needs to be done.

## 2017-11-22 ENCOUNTER — Telehealth: Payer: Self-pay | Admitting: Pharmacist

## 2017-11-22 NOTE — Telephone Encounter (Signed)
Oral Chemotherapy Pharmacist Encounter  Received a call from Mr. Osmon letting me know that he received his Lenvima on Saturday 12/1 and he plans to start taking it today 12/3.  I reviewed with him Lenvima dosing and administration. He stated he didn't have an additional questions.   Darl Pikes, PharmD, BCPS Hematology/Oncology Clinical Pharmacist [facility] [phone]  11/22/2017 11:41 AM

## 2017-11-30 ENCOUNTER — Other Ambulatory Visit: Payer: Self-pay | Admitting: *Deleted

## 2017-11-30 ENCOUNTER — Telehealth: Payer: Self-pay | Admitting: *Deleted

## 2017-11-30 ENCOUNTER — Inpatient Hospital Stay: Payer: Medicaid Other | Attending: Oncology

## 2017-11-30 DIAGNOSIS — B1920 Unspecified viral hepatitis C without hepatic coma: Secondary | ICD-10-CM | POA: Insufficient documentation

## 2017-11-30 DIAGNOSIS — R918 Other nonspecific abnormal finding of lung field: Secondary | ICD-10-CM | POA: Insufficient documentation

## 2017-11-30 DIAGNOSIS — Z809 Family history of malignant neoplasm, unspecified: Secondary | ICD-10-CM | POA: Insufficient documentation

## 2017-11-30 DIAGNOSIS — M109 Gout, unspecified: Secondary | ICD-10-CM | POA: Insufficient documentation

## 2017-11-30 DIAGNOSIS — Z79899 Other long term (current) drug therapy: Secondary | ICD-10-CM | POA: Insufficient documentation

## 2017-11-30 DIAGNOSIS — I81 Portal vein thrombosis: Secondary | ICD-10-CM | POA: Insufficient documentation

## 2017-11-30 DIAGNOSIS — C220 Liver cell carcinoma: Secondary | ICD-10-CM

## 2017-11-30 DIAGNOSIS — I10 Essential (primary) hypertension: Secondary | ICD-10-CM | POA: Insufficient documentation

## 2017-11-30 DIAGNOSIS — D696 Thrombocytopenia, unspecified: Secondary | ICD-10-CM | POA: Insufficient documentation

## 2017-11-30 DIAGNOSIS — G893 Neoplasm related pain (acute) (chronic): Secondary | ICD-10-CM | POA: Insufficient documentation

## 2017-11-30 LAB — CBC WITH DIFFERENTIAL/PLATELET
BASOS ABS: 0 10*3/uL (ref 0–0.1)
Basophils Relative: 1 %
EOS ABS: 0 10*3/uL (ref 0–0.7)
EOS PCT: 0 %
HCT: 46.4 % (ref 40.0–52.0)
Hemoglobin: 15.7 g/dL (ref 13.0–18.0)
Lymphocytes Relative: 35 %
Lymphs Abs: 3.4 10*3/uL (ref 1.0–3.6)
MCH: 31.8 pg (ref 26.0–34.0)
MCHC: 33.9 g/dL (ref 32.0–36.0)
MCV: 93.9 fL (ref 80.0–100.0)
MONO ABS: 1.2 10*3/uL — AB (ref 0.2–1.0)
Monocytes Relative: 13 %
Neutro Abs: 4.9 10*3/uL (ref 1.4–6.5)
Neutrophils Relative %: 51 %
PLATELETS: 178 10*3/uL (ref 150–440)
RBC: 4.94 MIL/uL (ref 4.40–5.90)
RDW: 15 % — AB (ref 11.5–14.5)
WBC: 9.6 10*3/uL (ref 3.8–10.6)

## 2017-11-30 LAB — COMPREHENSIVE METABOLIC PANEL
ALBUMIN: 2.4 g/dL — AB (ref 3.5–5.0)
ALK PHOS: 376 U/L — AB (ref 38–126)
ALT: 56 U/L (ref 17–63)
AST: 149 U/L — AB (ref 15–41)
Anion gap: 6 (ref 5–15)
BILIRUBIN TOTAL: 5 mg/dL — AB (ref 0.3–1.2)
BUN: 14 mg/dL (ref 6–20)
CALCIUM: 8.5 mg/dL — AB (ref 8.9–10.3)
CO2: 23 mmol/L (ref 22–32)
CREATININE: 1.05 mg/dL (ref 0.61–1.24)
Chloride: 100 mmol/L — ABNORMAL LOW (ref 101–111)
GFR calc Af Amer: >60 mL/min (ref >60)
GLUCOSE: 119 mg/dL — AB (ref 65–99)
Potassium: 4.2 mmol/L (ref 3.5–5.1)
Sodium: 129 mmol/L — ABNORMAL LOW (ref 135–145)
TOTAL PROTEIN: 9.2 g/dL — AB (ref 6.5–8.1)

## 2017-11-30 LAB — TSH: TSH: 9.91 u[IU]/mL — ABNORMAL HIGH (ref 0.350–4.500)

## 2017-11-30 MED ORDER — OXYCODONE HCL 5 MG PO TABS: 5.0000 mg | ORAL_TABLET | ORAL | 0 refills | Status: DC | PRN

## 2017-11-30 MED ORDER — ONDANSETRON HCL 4 MG PO TABS: 4.0000 mg | ORAL_TABLET | Freq: Four times a day (QID) | ORAL | 0 refills | Status: AC | PRN

## 2017-11-30 NOTE — Telephone Encounter (Signed)
Pt would like pain med for his abdominal pain and family member states he is also nauseated.  I spoke to rao and she told me to give him oxycodone and then send zofran in for his nausea.  Pt states his b/p is ok in 180's  It's ok he states .  I told him that b/p is high.  [facility]. I will call over there and see if I can get him appt. I have called [facility] times today and waited on phone 18-25 min each time to tell them about his b/p elevated and no one ever came to phone. Will try again tom.

## 2017-12-01 LAB — AFP TUMOR MARKER: AFP, SERUM, TUMOR MARKER: 1460 ng/mL — AB (ref 0.0–8.3)

## 2017-12-03 ENCOUNTER — Telehealth: Payer: Self-pay | Admitting: *Deleted

## 2017-12-03 NOTE — Telephone Encounter (Signed)
Been trying to call the [facility] several times this week holding on the phone 18-25 minutes at a time and never getting through.  Able to get through their phone today.  I was told by Jeannetta Nap that the patient actually is seen by Dr. Gwynneth Aliment at the [facility].  Contacted [place] at the community center and I got an appointment for the patient for December 17 at 1120.  I have called and left a message with his sister as well as calling the patient and left a message also about the appointment.  I have asked both of them to call me back when they get this message to make sure that they are able to make the appointment.  I had talked to the patient earlier in the week and his blood pressure was still elevated and that is why the appointment has been made with his PCP to manage his hypertension. I have also faxed recent notes and labs to their office

## 2017-12-10 ENCOUNTER — Other Ambulatory Visit: Payer: Self-pay | Admitting: *Deleted

## 2017-12-10 MED ORDER — OXYCODONE HCL 5 MG PO TABS: 5.0000 mg | ORAL_TABLET | ORAL | 0 refills | Status: DC | PRN

## 2017-12-15 ENCOUNTER — Other Ambulatory Visit: Payer: Self-pay | Admitting: *Deleted

## 2017-12-15 DIAGNOSIS — R160 Hepatomegaly, not elsewhere classified: Secondary | ICD-10-CM

## 2017-12-16 ENCOUNTER — Inpatient Hospital Stay: Payer: Medicaid Other

## 2017-12-16 ENCOUNTER — Telehealth: Payer: Self-pay | Admitting: Oncology

## 2017-12-16 ENCOUNTER — Encounter: Payer: Self-pay | Admitting: Oncology

## 2017-12-16 ENCOUNTER — Inpatient Hospital Stay (HOSPITAL_BASED_OUTPATIENT_CLINIC_OR_DEPARTMENT_OTHER): Payer: Medicaid Other | Admitting: Oncology

## 2017-12-16 VITALS — BP 130/87 | HR 119 | Temp 96.4°F | Resp 15 | Wt 159.0 lb

## 2017-12-16 DIAGNOSIS — I10 Essential (primary) hypertension: Secondary | ICD-10-CM

## 2017-12-16 DIAGNOSIS — D696 Thrombocytopenia, unspecified: Secondary | ICD-10-CM

## 2017-12-16 DIAGNOSIS — C220 Liver cell carcinoma: Secondary | ICD-10-CM | POA: Insufficient documentation

## 2017-12-16 DIAGNOSIS — B1920 Unspecified viral hepatitis C without hepatic coma: Secondary | ICD-10-CM

## 2017-12-16 DIAGNOSIS — R918 Other nonspecific abnormal finding of lung field: Secondary | ICD-10-CM

## 2017-12-16 DIAGNOSIS — C801 Malignant (primary) neoplasm, unspecified: Secondary | ICD-10-CM

## 2017-12-16 DIAGNOSIS — M109 Gout, unspecified: Secondary | ICD-10-CM

## 2017-12-16 DIAGNOSIS — I81 Portal vein thrombosis: Secondary | ICD-10-CM

## 2017-12-16 DIAGNOSIS — Z809 Family history of malignant neoplasm, unspecified: Secondary | ICD-10-CM

## 2017-12-16 DIAGNOSIS — G893 Neoplasm related pain (acute) (chronic): Secondary | ICD-10-CM

## 2017-12-16 DIAGNOSIS — R160 Hepatomegaly, not elsewhere classified: Secondary | ICD-10-CM

## 2017-12-16 DIAGNOSIS — Z79899 Other long term (current) drug therapy: Secondary | ICD-10-CM

## 2017-12-16 HISTORY — DX: Malignant (primary) neoplasm, unspecified: C80.1

## 2017-12-16 LAB — COMPREHENSIVE METABOLIC PANEL
ALT: 33 U/L (ref 17–63)
AST: 89 U/L — ABNORMAL HIGH (ref 15–41)
Albumin: 2.3 g/dL — ABNORMAL LOW (ref 3.5–5.0)
Alkaline Phosphatase: 253 U/L — ABNORMAL HIGH (ref 38–126)
Anion gap: 10 (ref 5–15)
BILIRUBIN TOTAL: 3.9 mg/dL — AB (ref 0.3–1.2)
BUN: 20 mg/dL (ref 6–20)
CHLORIDE: 99 mmol/L — AB (ref 101–111)
CO2: 24 mmol/L (ref 22–32)
CREATININE: 1 mg/dL (ref 0.61–1.24)
Calcium: 8.5 mg/dL — ABNORMAL LOW (ref 8.9–10.3)
Glucose, Bld: 133 mg/dL — ABNORMAL HIGH (ref 65–99)
Potassium: 3.6 mmol/L (ref 3.5–5.1)
Sodium: 133 mmol/L — ABNORMAL LOW (ref 135–145)
Total Protein: 9.3 g/dL — ABNORMAL HIGH (ref 6.5–8.1)

## 2017-12-16 LAB — TSH: TSH: 9.414 u[IU]/mL — AB (ref 0.350–4.500)

## 2017-12-16 LAB — CBC WITH DIFFERENTIAL/PLATELET
Basophils Absolute: 0.1 10*3/uL (ref 0–0.1)
Basophils Relative: 1 %
Eosinophils Absolute: 0.1 10*3/uL (ref 0–0.7)
Eosinophils Relative: 1 %
HEMATOCRIT: 50.8 % (ref 40.0–52.0)
HEMOGLOBIN: 16.7 g/dL (ref 13.0–18.0)
LYMPHS PCT: 33 %
Lymphs Abs: 3.1 10*3/uL (ref 1.0–3.6)
MCH: 31.4 pg (ref 26.0–34.0)
MCHC: 32.9 g/dL (ref 32.0–36.0)
MCV: 95.5 fL (ref 80.0–100.0)
MONO ABS: 0.8 10*3/uL (ref 0.2–1.0)
MONOS PCT: 9 %
NEUTROS ABS: 5.3 10*3/uL (ref 1.4–6.5)
NEUTROS PCT: 56 %
Platelets: 136 10*3/uL — ABNORMAL LOW (ref 150–440)
RBC: 5.32 MIL/uL (ref 4.40–5.90)
RDW: 16.2 % — AB (ref 11.5–14.5)
WBC: 9.3 10*3/uL (ref 3.8–10.6)

## 2017-12-16 LAB — PROTEIN, URINE, RANDOM: Total Protein, Urine: 46 mg/dL

## 2017-12-16 NOTE — Progress Notes (Signed)
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 Multiple areas of abnormal echogenicity within the liver which have increased since prior exam. Evolving mass lesions are suspected.  This was followed by a CT abdomen and pelvis with contrast which showed a large irregular multiloculated necrotic mass in the central liver measuring about 7.8 x 9.1 cm. 4.3 x 3.7 cm necrotic lymph node in the hepatoduodenal ligament with other hepatoduodenal ligament lymphadenopathy. The filling defect in the portal vein which I reviewed with radiology and consistent with tumor thrombus.  This was followed by MRI of the liver with and without contrast which showed a dominant high right hepatic lobe mass with hepatic and portal vein involvement. Characteristics favor hepatocellular carcinoma. Isolated metastatic disease including from colon cancer can look familiar but felt less likely. Suspicious lateral segment left liver lobe lesion likely metastatic disease. Nodal metastases within the porta hepatis.  The patient does not have any known chronic liver disease. No significant cirrhosis was noted on current imaging. HIV and hepatitis B testing was negative.  Patient was tested positive for hepatitis C with  high viral load.  AFP was also elevated at 1374  Patient reports abdominal pain is better with oxycodone. He drinks 24 oz of beer daily and has been doing so for many years. He lives alone in [place]. His uncle and kids live close by  Given that there was no underlying cirrhosis of the liver noted, patient underwent liver biopsy which was consistent with hepatocellular carcinoma.  CT chest without contrast showed subcentimeter lung nodules of undetermined etiology   lenvatinib started on 11/16/17.  Interval history-he is tolerating his lenvatinib well.  He has not taken medication for the last 3 days as he was unsure how to open the pill packet but otherwise has not missed any doses.  Reports mild nausea from the pill and could not afford Zofran.  Reports no constipation  ECOG PS- 1 Pain scale- 4 Opioid associated constipation- no  Review of systems- Review of Systems  Constitutional: Negative for chills, fever, malaise/fatigue and weight loss.  HENT: Negative for congestion, ear discharge and nosebleeds.   Eyes: Negative for blurred vision.  Respiratory: Negative for cough, hemoptysis, sputum production, shortness of breath and wheezing.   Cardiovascular: Negative for chest pain, palpitations, orthopnea and claudication.  Gastrointestinal: Negative for abdominal pain, blood in stool, constipation, diarrhea, heartburn, melena, nausea and vomiting.  Genitourinary: Negative for dysuria, flank pain, frequency, hematuria and urgency.  Musculoskeletal: Negative for back pain, joint pain and myalgias.  Skin: Negative for rash.  Neurological: Negative for dizziness, tingling, focal weakness, seizures, weakness and headaches.  Endo/Heme/Allergies: Does not bruise/bleed easily.  Psychiatric/Behavioral: Negative for depression and suicidal ideas. The patient does not have insomnia.      No Known Allergies   Past Medical History:  Diagnosis Date  . Carpal tunnel syndrome   . Gallstones     . Gout   . Hypertension      Past Surgical History:  Procedure Laterality Date  . NO PAST SURGERIES      Social History   Socioeconomic History  . Marital status: Single    Spouse name: Not on file  . Number of children: Not on file  . Years of education: Not on file  . Highest education level: Not on file  Social Needs  . Financial resource strain: Not on file  . Food insecurity - worry: Not on file  . Food insecurity - inability: Not on file  . Transportation needs - medical: Not on file  . Transportation needs - non-medical: Not on file  Occupational History  . Not on file  Tobacco Use  . Smoking status: Light Tobacco Smoker  . Smokeless tobacco: Never Used  Substance and Sexual Activity  . Alcohol use: Yes    Comment: 2-24 oz beer 4X per week  . Drug use: No  . Sexual activity: Not on file  Other Topics Concern  . Not on file  Social History Narrative  . Not on file    Family History  Problem Relation Age of Onset  . Cancer Mother   . CVA Father      Current Outpatient Medications:  .  amLODipine (NORVASC) 5 MG tablet, Take 1 tablet (5 mg total) by mouth daily., Disp: 30 tablet, Rfl: 2 .  Lenvatinib 12 MG Daily Dose 4 (3) MG CPPK, Take 12 mg by mouth daily. (Patient not taking: Reported on 11/16/2017), Disp: 90 each, Rfl: 3 .  ondansetron (ZOFRAN) 4 MG tablet, Take 1 tablet (4 mg total) by mouth every 6 (six) hours as needed for nausea or vomiting., Disp: 30 tablet, Rfl: 0 .  oxyCODONE (OXY IR/ROXICODONE) 5 MG immediate release tablet, Take 1 tablet (5 mg total) by mouth every 4 (four) hours as needed for moderate pain, severe pain or breakthrough pain., Disp: 60 tablet, Rfl: 0  Physical exam:  Vitals:   12/16/17 1432  BP: 130/87  Pulse: (!) 119  Resp: 15  Temp: (!) 96.4 F (35.8 C)  TempSrc: Tympanic  Weight: 159 lb (72.1 kg)   Physical Exam  Constitutional: He is oriented to person, place, and time and well-developed, well-nourished, and in no  distress.  HENT:  Head: Normocephalic and atraumatic.  Eyes: EOM are normal. Pupils are equal, round, and reactive to light.  Mild scleral icterus  Neck: Normal range of motion.  Cardiovascular: Normal rate, regular rhythm and normal heart sounds.  Pulmonary/Chest: Effort normal and breath sounds normal.  Abdominal: Soft. Bowel sounds are normal.  Neurological: He is alert and oriented to person, place, and time.  Skin: Skin is warm and dry.     CMP Latest Ref Rng & Units 12/16/2017  Glucose 65 - 99 mg/dL 133(H)  BUN 6 - 20 mg/dL 20  Creatinine 0.61 - 1.24 mg/dL 1.00  Sodium 135 - 145 mmol/L 133(L)  Potassium 3.5 - 5.1 mmol/L 3.6  Chloride 101 - 111 mmol/L 99(L)  CO2 22 - 32 mmol/L 24  Calcium 8.9 - 10.3 mg/dL 8.5(L)  Total Protein 6.5 - 8.1 g/dL 9.3(H)  Total Bilirubin 0.3 - 1.2 mg/dL 3.9(H)  Alkaline Phos 38 - 126 U/L 253(H)  AST 15 - 41 U/L 89(H)  ALT 17 - 63 U/L 33   CBC Latest Ref Rng & Units 12/16/2017  WBC 3.8 -  10.6 K/uL 9.3  Hemoglobin 13.0 - 18.0 g/dL 16.7  Hematocrit 40.0 - 52.0 % 50.8  Platelets 150 - 440 K/uL 136(L)    Assessment and plan- Patient is a 61 y.o. male with stage IV hepatocellular carcinoma currently on lenvatinib  Overall he is tolerating lenvatinib well excwept for mild nausea. Abdominal pain has improved and energy levels are better. Bilirubin is trending down and LFTs have laso improved. AFP from today is pending. Clinically he seems to be responding.  He did meet with Edwina Barth from pharmacy who has explained to him again how to take his medications and how to open his pill packets.  Patient verbalized understanding.  He should be getting his next shipment soon and he will call us if he does not receive it.  He does have 3 more pills remaining.  Repeat CMP in 2 weeks time and I will see him back in 1 month's time with a CBC, CMP, AFP and urine protein  Neoplasm related pain: controlled with oxycodone. He is only using 1 dose per  day  Mild thrombocytopenia: Continue to monitor   Visit Diagnosis 1. Hepatocellular carcinoma ([place])   2. High risk medication use   3. Neoplasm related pain      Dr. Randa Evens, MD, MPH [facility] at [facility] Pager- [phone] 12/16/2017 3:35 PM

## 2017-12-16 NOTE — Telephone Encounter (Signed)
Oral Oncology Patient Advocate Encounter  Called to set up patients next shipment for Lenvima to be sent out today and received Friday 12/17/2017.  [phone] [facility] Patient Advocate [phone] 12/16/2017 3:16 PM

## 2017-12-17 LAB — AFP TUMOR MARKER: AFP, SERUM, TUMOR MARKER: 1034 ng/mL — AB (ref 0.0–8.3)

## 2017-12-30 ENCOUNTER — Inpatient Hospital Stay: Payer: Medicaid Other | Attending: Oncology

## 2017-12-30 DIAGNOSIS — G893 Neoplasm related pain (acute) (chronic): Secondary | ICD-10-CM | POA: Insufficient documentation

## 2017-12-30 DIAGNOSIS — E871 Hypo-osmolality and hyponatremia: Secondary | ICD-10-CM | POA: Insufficient documentation

## 2017-12-30 DIAGNOSIS — C220 Liver cell carcinoma: Secondary | ICD-10-CM | POA: Insufficient documentation

## 2017-12-30 DIAGNOSIS — Z86711 Personal history of pulmonary embolism: Secondary | ICD-10-CM | POA: Diagnosis not present

## 2017-12-30 DIAGNOSIS — R000 Tachycardia, unspecified: Secondary | ICD-10-CM | POA: Diagnosis not present

## 2017-12-30 DIAGNOSIS — C7800 Secondary malignant neoplasm of unspecified lung: Secondary | ICD-10-CM | POA: Insufficient documentation

## 2017-12-30 DIAGNOSIS — K5903 Drug induced constipation: Secondary | ICD-10-CM | POA: Diagnosis not present

## 2017-12-30 DIAGNOSIS — B1920 Unspecified viral hepatitis C without hepatic coma: Secondary | ICD-10-CM | POA: Insufficient documentation

## 2017-12-30 DIAGNOSIS — C779 Secondary and unspecified malignant neoplasm of lymph node, unspecified: Secondary | ICD-10-CM | POA: Diagnosis not present

## 2017-12-30 DIAGNOSIS — Z5112 Encounter for antineoplastic immunotherapy: Secondary | ICD-10-CM | POA: Diagnosis not present

## 2017-12-30 LAB — COMPREHENSIVE METABOLIC PANEL
ALK PHOS: 160 U/L — AB (ref 38–126)
ALT: 27 U/L (ref 17–63)
AST: 93 U/L — ABNORMAL HIGH (ref 15–41)
Albumin: 2.5 g/dL — ABNORMAL LOW (ref 3.5–5.0)
Anion gap: 9 (ref 5–15)
BUN: 13 mg/dL (ref 6–20)
CALCIUM: 8.8 mg/dL — AB (ref 8.9–10.3)
CO2: 24 mmol/L (ref 22–32)
CREATININE: 0.98 mg/dL (ref 0.61–1.24)
Chloride: 96 mmol/L — ABNORMAL LOW (ref 101–111)
GFR calc non Af Amer: >60 mL/min (ref >60)
Glucose, Bld: 113 mg/dL — ABNORMAL HIGH (ref 65–99)
Potassium: 4.1 mmol/L (ref 3.5–5.1)
Sodium: 129 mmol/L — ABNORMAL LOW (ref 135–145)
Total Bilirubin: 2.7 mg/dL — ABNORMAL HIGH (ref 0.3–1.2)
Total Protein: 9.6 g/dL — ABNORMAL HIGH (ref 6.5–8.1)

## 2018-01-02 ENCOUNTER — Other Ambulatory Visit: Payer: Self-pay

## 2018-01-02 ENCOUNTER — Inpatient Hospital Stay: Payer: Medicaid Other

## 2018-01-02 ENCOUNTER — Emergency Department: Payer: Medicaid Other

## 2018-01-02 ENCOUNTER — Encounter: Payer: Self-pay | Admitting: Emergency Medicine

## 2018-01-02 ENCOUNTER — Inpatient Hospital Stay
Admission: EM | Admit: 2018-01-02 | Discharge: 2018-01-05 | DRG: 982 | Disposition: A | Discharge status: Home / Self Care | Payer: Medicaid Other | Attending: Internal Medicine | Admitting: Internal Medicine

## 2018-01-02 DIAGNOSIS — E871 Hypo-osmolality and hyponatremia: Secondary | ICD-10-CM | POA: Diagnosis present

## 2018-01-02 DIAGNOSIS — R1010 Upper abdominal pain, unspecified: Secondary | ICD-10-CM

## 2018-01-02 DIAGNOSIS — R109 Unspecified abdominal pain: Secondary | ICD-10-CM | POA: Diagnosis not present

## 2018-01-02 DIAGNOSIS — C7800 Secondary malignant neoplasm of unspecified lung: Secondary | ICD-10-CM | POA: Diagnosis present

## 2018-01-02 DIAGNOSIS — C220 Liver cell carcinoma: Secondary | ICD-10-CM | POA: Diagnosis present

## 2018-01-02 DIAGNOSIS — K5900 Constipation, unspecified: Secondary | ICD-10-CM | POA: Diagnosis present

## 2018-01-02 DIAGNOSIS — B1920 Unspecified viral hepatitis C without hepatic coma: Secondary | ICD-10-CM | POA: Diagnosis present

## 2018-01-02 DIAGNOSIS — Z9221 Personal history of antineoplastic chemotherapy: Secondary | ICD-10-CM

## 2018-01-02 DIAGNOSIS — R0602 Shortness of breath: Secondary | ICD-10-CM | POA: Diagnosis not present

## 2018-01-02 DIAGNOSIS — F17200 Nicotine dependence, unspecified, uncomplicated: Secondary | ICD-10-CM | POA: Diagnosis present

## 2018-01-02 DIAGNOSIS — Z823 Family history of stroke: Secondary | ICD-10-CM | POA: Diagnosis not present

## 2018-01-02 DIAGNOSIS — I10 Essential (primary) hypertension: Secondary | ICD-10-CM | POA: Diagnosis not present

## 2018-01-02 DIAGNOSIS — I2699 Other pulmonary embolism without acute cor pulmonale: Secondary | ICD-10-CM | POA: Diagnosis present

## 2018-01-02 DIAGNOSIS — D696 Thrombocytopenia, unspecified: Secondary | ICD-10-CM | POA: Diagnosis present

## 2018-01-02 HISTORY — DX: Unspecified viral hepatitis C without hepatic coma: B19.20

## 2018-01-02 HISTORY — DX: Malignant (primary) neoplasm, unspecified: C80.1

## 2018-01-02 LAB — URINALYSIS, COMPLETE (UACMP) WITH MICROSCOPIC
BILIRUBIN URINE: NEGATIVE
Bacteria, UA: NONE SEEN
GLUCOSE, UA: NEGATIVE mg/dL
Hgb urine dipstick: NEGATIVE
KETONES UR: 5 mg/dL — AB
LEUKOCYTES UA: NEGATIVE
Nitrite: NEGATIVE
PH: 5 (ref 5.0–8.0)
Protein, ur: NEGATIVE mg/dL
Specific Gravity, Urine: >1.046 — ABNORMAL HIGH (ref 1.005–1.030)

## 2018-01-02 LAB — DIFFERENTIAL
BLASTS: 0 %
Band Neutrophils: 0 %
Basophils Absolute: 0 10*3/uL (ref 0–0.1)
Basophils Relative: 0 %
EOS PCT: 0 %
Eosinophils Absolute: 0 10*3/uL (ref 0–0.7)
Lymphocytes Relative: 39 %
Lymphs Abs: 2.2 10*3/uL (ref 1.0–3.6)
MONO ABS: 0.4 10*3/uL (ref 0.2–1.0)
MYELOCYTES: 0 %
Metamyelocytes Relative: 0 %
Monocytes Relative: 8 %
NRBC: 0 /100{WBCs}
Neutro Abs: 3 10*3/uL (ref 1.4–6.5)
Neutrophils Relative %: 53 %
Other: 0 %
Promyelocytes Absolute: 0 %

## 2018-01-02 LAB — COMPREHENSIVE METABOLIC PANEL
ALT: 24 U/L (ref 17–63)
ANION GAP: 9 (ref 5–15)
AST: 80 U/L — ABNORMAL HIGH (ref 15–41)
Albumin: 2.4 g/dL — ABNORMAL LOW (ref 3.5–5.0)
Alkaline Phosphatase: 134 U/L — ABNORMAL HIGH (ref 38–126)
BUN: 19 mg/dL (ref 6–20)
CO2: 24 mmol/L (ref 22–32)
Calcium: 9.1 mg/dL (ref 8.9–10.3)
Chloride: 96 mmol/L — ABNORMAL LOW (ref 101–111)
Creatinine, Ser: 0.85 mg/dL (ref 0.61–1.24)
Glucose, Bld: 102 mg/dL — ABNORMAL HIGH (ref 65–99)
POTASSIUM: 4 mmol/L (ref 3.5–5.1)
Sodium: 129 mmol/L — ABNORMAL LOW (ref 135–145)
Total Bilirubin: 2.3 mg/dL — ABNORMAL HIGH (ref 0.3–1.2)
Total Protein: 9.2 g/dL — ABNORMAL HIGH (ref 6.5–8.1)

## 2018-01-02 LAB — CBC
HCT: 59.3 % — ABNORMAL HIGH (ref 40.0–52.0)
HEMOGLOBIN: 19.1 g/dL — AB (ref 13.0–18.0)
MCH: 30.6 pg (ref 26.0–34.0)
MCHC: 32.3 g/dL (ref 32.0–36.0)
MCV: 94.7 fL (ref 80.0–100.0)
PLATELETS: 124 10*3/uL — AB (ref 150–440)
RBC: 6.26 MIL/uL — ABNORMAL HIGH (ref 4.40–5.90)
RDW: 16.2 % — ABNORMAL HIGH (ref 11.5–14.5)
WBC: 5.6 10*3/uL (ref 3.8–10.6)

## 2018-01-02 LAB — APTT: aPTT: 40 seconds — ABNORMAL HIGH (ref 24–36)

## 2018-01-02 LAB — PROTIME-INR
INR: 1.39
PROTHROMBIN TIME: 16.9 s — AB (ref 11.4–15.2)

## 2018-01-02 LAB — LIPASE, BLOOD: LIPASE: 29 U/L (ref 11–51)

## 2018-01-02 MED ORDER — BISACODYL 5 MG PO TBEC
5.0000 mg | DELAYED_RELEASE_TABLET | Freq: Every day | ORAL | Status: DC | PRN
  Administered 2018-01-03 – 2018-01-04 (×2): 5 mg via ORAL
  Filled 2018-01-02 (×2): qty 1

## 2018-01-02 MED ORDER — ONDANSETRON HCL 4 MG PO TABS
4.0000 mg | ORAL_TABLET | Freq: Four times a day (QID) | ORAL | Status: DC | PRN
  Administered 2018-01-03: 4 mg via ORAL
  Filled 2018-01-02: qty 1

## 2018-01-02 MED ORDER — SENNOSIDES-DOCUSATE SODIUM 8.6-50 MG PO TABS
1.0000 | ORAL_TABLET | Freq: Every evening | ORAL | Status: DC | PRN
  Filled 2018-01-02: qty 1

## 2018-01-02 MED ORDER — IOPAMIDOL (ISOVUE-300) INJECTION 61%
75.0000 mL | Freq: Once | INTRAVENOUS | Status: AC | PRN
  Administered 2018-01-02: 75 mL via INTRAVENOUS

## 2018-01-02 MED ORDER — ALBUTEROL SULFATE (2.5 MG/3ML) 0.083% IN NEBU: 2.5000 mg | INHALATION_SOLUTION | RESPIRATORY_TRACT | Status: DC | PRN

## 2018-01-02 MED ORDER — HEPARIN (PORCINE) IN NACL 100-0.45 UNIT/ML-% IJ SOLN
1150.0000 [IU]/h | INTRAMUSCULAR | Status: DC
  Administered 2018-01-02 – 2018-01-03 (×2): 1150 [IU]/h via INTRAVENOUS
  Filled 2018-01-02 (×2): qty 250

## 2018-01-02 MED ORDER — OXYCODONE HCL 5 MG PO TABS
5.0000 mg | ORAL_TABLET | ORAL | Status: DC | PRN
  Administered 2018-01-02 – 2018-01-03 (×2): 5 mg via ORAL
  Filled 2018-01-02 (×3): qty 1

## 2018-01-02 MED ORDER — ISOSORBIDE MONONITRATE ER 30 MG PO TB24
30.0000 mg | ORAL_TABLET | Freq: Every day | ORAL | Status: DC
Start: 2018-01-02 — End: 2018-01-05
  Administered 2018-01-02 – 2018-01-05 (×4): 30 mg via ORAL
  Filled 2018-01-02 (×4): qty 1

## 2018-01-02 MED ORDER — IOPAMIDOL (ISOVUE-300) INJECTION 61%
30.0000 mL | Freq: Once | INTRAVENOUS | Status: AC | PRN
  Administered 2018-01-02: 30 mL via ORAL

## 2018-01-02 MED ORDER — HYDRALAZINE HCL 20 MG/ML IJ SOLN: 10.0000 mg | Freq: Four times a day (QID) | INTRAMUSCULAR | Status: DC | PRN

## 2018-01-02 MED ORDER — ACETAMINOPHEN 650 MG RE SUPP: 650.0000 mg | Freq: Four times a day (QID) | RECTAL | Status: DC | PRN

## 2018-01-02 MED ORDER — ACETAMINOPHEN 325 MG PO TABS: 650.0000 mg | ORAL_TABLET | Freq: Four times a day (QID) | ORAL | Status: DC | PRN

## 2018-01-02 MED ORDER — DOCUSATE SODIUM 100 MG PO CAPS
100.0000 mg | ORAL_CAPSULE | Freq: Two times a day (BID) | ORAL | Status: DC
  Administered 2018-01-02 – 2018-01-05 (×7): 100 mg via ORAL
  Filled 2018-01-02 (×7): qty 1

## 2018-01-02 MED ORDER — HEPARIN BOLUS VIA INFUSION
3400.0000 [IU] | Freq: Once | INTRAVENOUS | Status: AC
  Administered 2018-01-02: 18:00:00 3400 [IU] via INTRAVENOUS
  Filled 2018-01-02: qty 3400

## 2018-01-02 MED ORDER — ONDANSETRON HCL 4 MG/2ML IJ SOLN: 4.0000 mg | Freq: Four times a day (QID) | INTRAMUSCULAR | Status: DC | PRN

## 2018-01-02 MED ORDER — SODIUM CHLORIDE 0.9 % IV SOLN
INTRAVENOUS | Status: DC
  Administered 2018-01-02: 15:00:00 via INTRAVENOUS

## 2018-01-02 MED ORDER — MORPHINE SULFATE (PF) 4 MG/ML IV SOLN
4.0000 mg | INTRAVENOUS | Status: DC | PRN
  Administered 2018-01-02 – 2018-01-05 (×3): 4 mg via INTRAVENOUS
  Filled 2018-01-02 (×3): qty 1

## 2018-01-02 MED ORDER — AMLODIPINE BESYLATE 5 MG PO TABS
5.0000 mg | ORAL_TABLET | Freq: Every day | ORAL | Status: DC
  Administered 2018-01-02 – 2018-01-05 (×4): 5 mg via ORAL
  Filled 2018-01-02 (×4): qty 1

## 2018-01-02 NOTE — Progress Notes (Addendum)
ANTICOAGULATION CONSULT NOTE   Pharmacy Consult for heparin drip Indication: DVT  No Known Allergies  Patient Measurements: Height: 5\' 10"  (177.8 cm) Weight: 149 lb 8 oz (67.8 kg) IBW/kg (Calculated) : 73 Heparin Dosing Weight: 67.8 kg  Vital Signs: Temp: 97.7 F (36.5 C) (01/13 1220) Temp Source: Oral (01/13 1220) BP: 150/108 (01/13 1220) Pulse Rate: 100 (01/13 1220)  Labs: Recent Labs    01/02/18 0757 01/02/18 0854 01/02/18 1253  HGB 19.1*  --   --   HCT 59.3*  --   --   PLT 124*  --   --   APTT  --   --  40*  LABPROT  --   --  16.9*  INR  --   --  1.39  CREATININE  --  0.85  --     Estimated Creatinine Clearance: 87.5 mL/min (by C-G formula based on SCr of 0.85 mg/dL).   Medical History: Past Medical History:  Diagnosis Date  . Cancer ([place]) 12/16/2017   hepatocellular carcinoma  . Carpal tunnel syndrome   . Gallstones   . Gout   . Hepatitis C 11/09/2017  . Hypertension     Medications:  Patient was not on any anticoagulants at home.   Assessment: 62 yo male with recent diagnosis of hepatocellular carcinoma presents with increased abdominal pain and on admission found to have a PE on the left side.    Goal of Therapy:  Heparin level 0.3-0.7 units/ml Monitor platelets by anticoagulation protocol: Yes   Plan:  Give 3400 units bolus x 1 Start heparin infusion at 1150 units/hr Check anti-Xa level in 6 hours and daily while on heparin Continue to monitor H&H and platelets  Baseline APTT, PT INR slightly elevated at baseline likely due to liver disease.   Lendon Ka, PharmD Pharmacy Resident 01/02/2018,3:34 PM    01/14 0100 heparin level 0.56. Continue current regimen. Recheck in 6 hours to confirm.  Sim Boast, PharmD, BCPS  01/03/18 1:49 AM

## 2018-01-02 NOTE — ED Triage Notes (Signed)
Arrives with C/O right sided abdominal pain x 3 months.  States he was ?diagnosed with liver / gastric cancer 3 months ago and pain has been intermittent since then.   Patient states he also has Hep C.  Patient is a poor historian

## 2018-01-02 NOTE — H&P (Signed)
[place] [facility] [place] NAME: Tim Potter    MR#:  [id number]  DATE OF BIRTH:  07/19/1956  DATE OF ADMISSION:  01/02/2018  PRIMARY CARE PHYSICIAN: Patient, No Pcp Per   REQUESTING/REFERRING PHYSICIAN: Dr. Rip Harbour.  CHIEF COMPLAINT:   Chief Complaint  Patient presents with  . Abdominal Pain   Abdominal pain for 3 months, worsening for a few days. HISTORY OF PRESENT ILLNESS:  Tim Potter  is a 62 y.o. male with a known history of liver cancer, gallbladder stone, hepatitis C and hypertension.  The patient presented the ED with above chief complaints.  He has had abdominal pain majorly on the right side for the past 3 months.  He has decreased appetite and weight loss.  He was recently diagnosed with hepatocellular carcinoma.  The CAT scan of the abdomen show metastasis and PE on the left side. PAST MEDICAL HISTORY:   Past Medical History:  Diagnosis Date  . Cancer ([place]) 12/16/2017   hepatocellular carcinoma  . Carpal tunnel syndrome   . Gallstones   . Gout   . Hepatitis C 11/09/2017  . Hypertension     PAST SURGICAL HISTORY:   Past Surgical History:  Procedure Laterality Date  . NO PAST SURGERIES      SOCIAL HISTORY:   Social History   Tobacco Use  . Smoking status: Light Tobacco Smoker  . Smokeless tobacco: Never Used  Substance Use Topics  . Alcohol use: Yes    Comment: 2-24 oz beer 4X per week    FAMILY HISTORY:   Family History  Problem Relation Age of Onset  . Cancer Mother   . CVA Father     DRUG ALLERGIES:  No Known Allergies  REVIEW OF SYSTEMS:   Review of Systems  Constitutional: Negative for chills, fever and malaise/fatigue.  HENT: Negative for sore throat.   Eyes: Negative for blurred vision and double vision.  Respiratory: Negative for cough, hemoptysis, shortness of breath, wheezing and stridor.   Cardiovascular: Negative for chest pain, palpitations, orthopnea and leg swelling.    Gastrointestinal: Positive for abdominal pain and nausea. Negative for blood in stool, diarrhea, melena and vomiting.  Genitourinary: Negative for dysuria, flank pain and hematuria.  Musculoskeletal: Negative for back pain and joint pain.  Skin: Negative for rash.  Neurological: Negative for dizziness, sensory change, focal weakness, seizures, loss of consciousness, weakness and headaches.  Endo/Heme/Allergies: Negative for polydipsia.  Psychiatric/Behavioral: Negative for depression. The patient is not nervous/anxious.     MEDICATIONS AT HOME:   Prior to Admission medications   Medication Sig Start Date End Date Taking? Authorizing Provider  amLODipine (NORVASC) 5 MG tablet Take 1 tablet (5 mg total) by mouth daily. 11/09/17  Yes Gladstone Lighter, MD  Lenvatinib 12 MG Daily Dose 4 (3) MG CPPK Take 12 mg by mouth daily. 11/09/17  Yes Gladstone Lighter, MD  oxyCODONE (OXY IR/ROXICODONE) 5 MG immediate release tablet Take 1 tablet (5 mg total) by mouth every 4 (four) hours as needed for moderate pain, severe pain or breakthrough pain. 12/10/17  Yes Sindy Guadeloupe, MD  ondansetron (ZOFRAN) 4 MG tablet Take 1 tablet (4 mg total) by mouth every 6 (six) hours as needed for nausea or vomiting. Patient not taking: Reported on 12/16/2017 11/30/17   Sindy Guadeloupe, MD      VITAL SIGNS:  Blood pressure (!) 150/108, pulse 100, temperature 97.7 F (36.5 C), temperature source Oral, resp. rate (!) 24, height  5\' 10"  (1.778 m), weight 149 lb 8 oz (67.8 kg), SpO2 97 %.  PHYSICAL EXAMINATION:  Physical Exam  GENERAL:  62 y.o.-year-old patient lying in the bed with no acute distress.  EYES: Pupils equal, round, reactive to light and accommodation. No scleral icterus. Extraocular muscles intact.  HEENT: Head atraumatic, normocephalic. Oropharynx and nasopharynx clear.  NECK:  Supple, no jugular venous distention. No thyroid enlargement, no tenderness.  LUNGS: Normal breath sounds bilaterally, no  wheezing, rales,rhonchi or crepitation. No use of accessory muscles of respiration.  CARDIOVASCULAR: S1, S2 normal. No murmurs, rubs, or gallops.  ABDOMEN: Soft, tenderness on RUQ, nondistended. Bowel sounds present. No organomegaly or mass.  EXTREMITIES: No pedal edema, cyanosis, or clubbing.  NEUROLOGIC: Cranial nerves II through XII are intact. Muscle strength 5/5 in all extremities. Sensation intact. Gait not checked.  PSYCHIATRIC: The patient is alert and oriented x 3.  SKIN: No obvious rash, lesion, or ulcer.   LABORATORY PANEL:   CBC Recent Labs  Lab 01/02/18 0757  WBC 5.6  HGB 19.1*  HCT 59.3*  PLT 124*   ------------------------------------------------------------------------------------------------------------------  Chemistries  Recent Labs  Lab 01/02/18 0854  NA 129*  K 4.0  CL 96*  CO2 24  GLUCOSE 102*  BUN 19  CREATININE 0.85  CALCIUM 9.1  AST 80*  ALT 24  ALKPHOS 134*  BILITOT 2.3*   ------------------------------------------------------------------------------------------------------------------  Cardiac Enzymes No results for input(s): TROPONINI in the last 168 hours. ------------------------------------------------------------------------------------------------------------------  RADIOLOGY:  Ct Abdomen Pelvis W Contrast  Result Date: 01/02/2018 CLINICAL DATA:  Right-sided abdominal pain for 3 months. Gastric/liver cancer diagnosed 3 months ago. Hepatitis-C. Diverticulitis suspected. EXAM: CT ABDOMEN AND PELVIS WITH CONTRAST TECHNIQUE: Multidetector CT imaging of the abdomen and pelvis was performed using the standard protocol following bolus administration of intravenous contrast. CONTRAST:  10mL ISOVUE-300 IOPAMIDOL (ISOVUE-300) INJECTION 61% COMPARISON:  11/08/2017 MRI.  11/07/2017 CT. FINDINGS: Lower chest: Right base atelectasis. New and enlarged pulmonary nodules, including an index 6 mm right lower lobe pulmonary nodule on image 1/series 4.  Normal heart size without pericardial or pleural effusion. Multivessel coronary artery atherosclerosis. Left-sided pulmonary emboli, including on image 3/series 2. Hepatobiliary: Central high right hepatic lobe mass measures 10.5 x 7.9 cm on image 18/series 2 versus 9.1 x 7.8 cm on the prior. Tumor extension into the IVC is new, including on image 16/series 2. Enlargement of the lateral segment left liver lobe lesion at 12 mm on image 22/series 2. Progression of moderate intrahepatic biliary duct dilatation, including on image 25/series 2. Gallstones without specific evidence of acute cholecystitis. No common duct dilatation. Pancreas: Normal, without mass or ductal dilatation. Spleen: Normal in size, without focal abnormality. Adrenals/Urinary Tract: Normal adrenal glands. Normal kidneys, without hydronephrosis. Normal urinary bladder. Stomach/Bowel: Normal stomach, without wall thickening. Normal colon, appendix, and terminal ileum. Underdistended proximal jejunum. Vascular/Lymphatic: Aortic and branch vessel atherosclerosis. Progressive portal vein involvement by liver tumor and porta hepatis adenopathy. Thrombus continuing into the superior mesenteric vein including on image 37/series 2. Secondary collaterals within the gastrohepatic ligament and surrounding the distal esophagus. Porta hepatis necrotic adenopathy at 5.4 x 6.7 cm on image 29/series 2 versus 4.3 x 3.7 cm on the prior. New retroperitoneal adenopathy, with a left periaortic node measuring 1.9 cm. No pelvic sidewall adenopathy. Reproductive: Moderate prostatomegaly. Other: New small volume abdominopelvic ascites. Musculoskeletal: Disc bulges at L4-5 and L5-S1. IMPRESSION: 1. Significant progression of disease throughout the abdomen, including within the liver and abdominal nodal stations. 2. Left lower lobe pulmonary  emboli, incompletely imaged. 3. Progressive pulmonary metastasis. 4. Progressive vascular extension of tumor, including into the IVC,  portal vein, and superior mesenteric veins. 5. Progressive moderate intrahepatic biliary duct dilatation due to mass effect by central tumor and adenopathy. 6. Cholelithiasis. 7. New small volume abdominopelvic ascites. These results were called by telephone at the time of interpretation on 01/02/2018 at 10:52 am to Dr. Conni Slipper , who verbally acknowledged these results. Electronically Signed   By: Abigail Miyamoto M.D.   On: 01/02/2018 10:55      IMPRESSION AND PLAN:   Pulmonary embolism The patient will be admitted to medical floor.  Start heparin drip, follow PTT.  Follow-up venous ultrasound of both legs.  Elevated bilirubin, possible due to hepatocellular cancer.  Hyponatremia.  Start normal saline IV and follow-up BMP.  Hepatocellular cancer with metastasis. Oncology consult and palliative care consult.  Thrombocytopenia.  Follow-up CBC and oncologist consult.  Hypertension malignancy, continue Norvasc, add Imdur for and IV hydralazine as needed.  All the records are reviewed and case discussed with ED provider. Management plans discussed with the patient, his wife and they are in agreement.  CODE STATUS: Full code  TOTAL TIME TAKING CARE OF THIS PATIENT: 56 minutes.    Demetrios Loll M.D on 01/02/2018 at 2:09 PM  Between 7am to 6pm - Pager - [phone]  After 6pm go to [email or web address] - Proofreader  [facility]  Office  [phone]  CC: Primary care physician; Patient, No Pcp Per   Note: This dictation was prepared with Dragon dictation along with smaller phrase technology. Any transcriptional errors that result from this process are unin

## 2018-01-02 NOTE — ED Provider Notes (Signed)
[facility] Emergency Department Provider Note   ____________________________________________   First MD Initiated Contact with Patient 01/02/18 [phone]     (approximate)  I have reviewed the triage vital signs and the nursing notes.   HISTORY  Chief Complaint Abdominal Pain    HPI Tim Potter is a 62 y.o. male Complains of right upper abdominal pain for about 3 months. He was diagnosed with liver cancer 3 months ago and the pain has been coming and going since then. Right now he says the pain is okay but it is tender in his right upper quadrant. Pain is severe at times but not at present.pain is worse with palpation   Past Medical History:  Diagnosis Date  . Cancer ([place]) 12/16/2017   hepatocellular carcinoma  . Carpal tunnel syndrome   . Gallstones   . Gout   . Hepatitis C 11/09/2017  . Hypertension     Patient Active Problem List   Diagnosis Date Noted  . Pulmonary emboli ([place]) 01/02/2018  . Hepatocellular carcinoma ([place]) 12/16/2017  . Goals of care, counseling/discussion 11/16/2017  . Liver mass 11/07/2017    Past Surgical History:  Procedure Laterality Date  . NO PAST SURGERIES      Prior to Admission medications   Medication Sig Start Date End Date Taking? Authorizing Provider  amLODipine (NORVASC) 5 MG tablet Take 1 tablet (5 mg total) by mouth daily. 11/09/17  Yes Gladstone Lighter, MD  Lenvatinib 12 MG Daily Dose 4 (3) MG CPPK Take 12 mg by mouth daily. 11/09/17  Yes Gladstone Lighter, MD  oxyCODONE (OXY IR/ROXICODONE) 5 MG immediate release tablet Take 1 tablet (5 mg total) by mouth every 4 (four) hours as needed for moderate pain, severe pain or breakthrough pain. 12/10/17  Yes Sindy Guadeloupe, MD  ondansetron (ZOFRAN) 4 MG tablet Take 1 tablet (4 mg total) by mouth every 6 (six) hours as needed for nausea or vomiting. Patient not taking: Reported on 12/16/2017 11/30/17   Sindy Guadeloupe, MD    Allergies Patient has no known  allergies.  Family History  Problem Relation Age of Onset  . Cancer Mother   . CVA Father     Social History Social History   Tobacco Use  . Smoking status: Light Tobacco Smoker  . Smokeless tobacco: Never Used  Substance Use Topics  . Alcohol use: Yes    Comment: 2-24 oz beer 4X per week  . Drug use: No    Review of Systems  Constitutional: No fever/chills Eyes: No visual changes. ENT: No sore throat. Cardiovascular: Denies chest pain. Respiratory: Denies shortness of breath. Gastrointestinal:right upper quadrant abdominal pain.  No nausea, no vomiting.  No diarrhea.  No constipation. Genitourinary: Negative for dysuria. Musculoskeletal: Negative for back pain. Skin: Negative for rash. Neurological: Negative for headaches, focal weakness   ____________________________________________   PHYSICAL EXAM:  VITAL SIGNS: ED Triage Vitals  Enc Vitals Group     BP 01/02/18 0747 (!) 165/112     Pulse Rate 01/02/18 0747 (!) 104     Resp 01/02/18 0747 (!) 22     Temp 01/02/18 0747 97.7 F (36.5 C)     Temp Source 01/02/18 0747 Oral     SpO2 01/02/18 0747 95 %     Weight 01/02/18 0745 160 lb (72.6 kg)     Height 01/02/18 0745 5\' 10"  (1.778 m)     Head Circumference --      Peak Flow --  Pain Score 01/02/18 0745 10     Pain Loc --      Pain Edu? --      Excl. in [place]? --     Constitutional: Alert and oriented. Well appearing and in no acute distress. Eyes: Conjunctivae are normal.  Head: Atraumatic. Nose: No congestion/rhinnorhea. Mouth/Throat: Mucous membranes are moist.  Oropharynx non-erythematous. Neck: No stridor.  Cardiovascular: Normal rate, regular rhythm. Grossly normal heart sounds.  Good peripheral circulation. Respiratory: Normal respiratory effort.  No retractions. Lungs CTAB. Gastrointestinal: Soft tender to palpation in the right upper quadrant only liver edge is palpable about 2 cm below the rib margin liver edge is smooth No distention. No  abdominal bruits. No CVA tenderness. Musculoskeletal: No lower extremity tenderness nor edema.  No joint effusions. Neurologic:  Normal speech and language. No gross focal neurologic deficits are appreciated. No gait instability. Skin:  Skin is warm, dry and intact. No rash noted. Psychiatric: Mood and affect are normal. Speech and behavior are normal.  ____________________________________________   LABS (all labs ordered are listed, but only abnormal results are displayed)  Labs Reviewed  CBC - Abnormal; Notable for the following components:      Result Value   RBC 6.26 (*)    Hemoglobin 19.1 (*)    HCT 59.3 (*)    RDW 16.2 (*)    Platelets 124 (*)    All other components within normal limits  COMPREHENSIVE METABOLIC PANEL - Abnormal; Notable for the following components:   Sodium 129 (*)    Chloride 96 (*)    Glucose, Bld 102 (*)    Total Protein 9.2 (*)    Albumin 2.4 (*)    AST 80 (*)    Alkaline Phosphatase 134 (*)    Total Bilirubin 2.3 (*)    All other components within normal limits  LIPASE, BLOOD  DIFFERENTIAL  URINALYSIS, COMPLETE (UACMP) WITH MICROSCOPIC   ____________________________________________  EKG  EKG read and interpreted by me shows sinus tachycardia rate of 103 normal axis low amplitude in the limb leads and no acute ST-T changes seen ____________________________________________  RADIOLOGY CT reported to me by radiology as increasing tumor burden progression of cancer and chronic lymph nodes cancers now moved into the lungs as well and he has pulmonary emboli. ____________________________________________   PROCEDURES  Procedure(s) performed:   Procedures  Critical Care performed:  ____________________________________________   INITIAL IMPRESSION / ASSESSMENT AND PLAN / ED COURSE  patient has worsening pain progression of the tumor to include the progression into the superior mesenteric artery he has necrotic lymph glands aggression into  the lungs and now pulmonary emboli. This is all since his last CT on December 27. We will admit the patient.      ____________________________________________   FINAL CLINICAL IMPRESSION(S) / ED DIAGNOSES  Final diagnoses:  Pain of upper abdomen  Other acute pulmonary embolism without acute cor pulmonale ([place])   worsening cancer  ED Discharge Orders    None       Note:  This document was prepared using Dragon voice recognition software and may include unintentional dictation errors.    Nena Polio, MD 01/02/18 1114

## 2018-01-02 NOTE — ED Notes (Signed)
Floor had me ask dr chin regarding the pt's diastolic - order is already in place for hydralizine q6 hrs.

## 2018-01-02 NOTE — ED Notes (Signed)
First Nurse Note: Pt ambulatory into ED c/o severe abdominal pain and back pain. Pt in NAD at this time.

## 2018-01-03 ENCOUNTER — Other Ambulatory Visit: Payer: Self-pay | Admitting: Oncology

## 2018-01-03 DIAGNOSIS — I2699 Other pulmonary embolism without acute cor pulmonale: Secondary | ICD-10-CM

## 2018-01-03 DIAGNOSIS — Z7189 Other specified counseling: Secondary | ICD-10-CM

## 2018-01-03 DIAGNOSIS — G893 Neoplasm related pain (acute) (chronic): Secondary | ICD-10-CM

## 2018-01-03 DIAGNOSIS — C7800 Secondary malignant neoplasm of unspecified lung: Secondary | ICD-10-CM

## 2018-01-03 DIAGNOSIS — Z515 Encounter for palliative care: Secondary | ICD-10-CM

## 2018-01-03 DIAGNOSIS — K5903 Drug induced constipation: Secondary | ICD-10-CM

## 2018-01-03 DIAGNOSIS — C220 Liver cell carcinoma: Secondary | ICD-10-CM

## 2018-01-03 DIAGNOSIS — R0602 Shortness of breath: Secondary | ICD-10-CM

## 2018-01-03 DIAGNOSIS — C779 Secondary and unspecified malignant neoplasm of lymph node, unspecified: Secondary | ICD-10-CM

## 2018-01-03 DIAGNOSIS — R1010 Upper abdominal pain, unspecified: Secondary | ICD-10-CM

## 2018-01-03 DIAGNOSIS — R109 Unspecified abdominal pain: Secondary | ICD-10-CM

## 2018-01-03 DIAGNOSIS — I10 Essential (primary) hypertension: Secondary | ICD-10-CM

## 2018-01-03 LAB — BASIC METABOLIC PANEL
Anion gap: 6 (ref 5–15)
BUN: 18 mg/dL (ref 6–20)
CHLORIDE: 96 mmol/L — AB (ref 101–111)
CO2: 26 mmol/L (ref 22–32)
Calcium: 8.2 mg/dL — ABNORMAL LOW (ref 8.9–10.3)
Creatinine, Ser: 0.7 mg/dL (ref 0.61–1.24)
GFR calc Af Amer: >60 mL/min (ref >60)
GFR calc non Af Amer: >60 mL/min (ref >60)
GLUCOSE: 126 mg/dL — AB (ref 65–99)
POTASSIUM: 3.9 mmol/L (ref 3.5–5.1)
Sodium: 128 mmol/L — ABNORMAL LOW (ref 135–145)

## 2018-01-03 LAB — HEPARIN LEVEL (UNFRACTIONATED)
Heparin Unfractionated: 0.56 IU/mL (ref 0.30–0.70)
Heparin Unfractionated: 0.68 IU/mL (ref 0.30–0.70)

## 2018-01-03 LAB — CBC
HEMATOCRIT: 52.7 % — AB (ref 40.0–52.0)
Hemoglobin: 17.8 g/dL (ref 13.0–18.0)
MCH: 31.5 pg (ref 26.0–34.0)
MCHC: 33.8 g/dL (ref 32.0–36.0)
MCV: 93.1 fL (ref 80.0–100.0)
Platelets: 107 10*3/uL — ABNORMAL LOW (ref 150–440)
RBC: 5.66 MIL/uL (ref 4.40–5.90)
RDW: 15.7 % — AB (ref 11.5–14.5)
WBC: 5.4 10*3/uL (ref 3.8–10.6)

## 2018-01-03 MED ORDER — POLYETHYLENE GLYCOL 3350 17 G PO PACK
17.0000 g | PACK | Freq: Every day | ORAL | Status: DC
  Administered 2018-01-03 – 2018-01-05 (×2): 17 g via ORAL
  Filled 2018-01-03 (×2): qty 1

## 2018-01-03 MED ORDER — ENOXAPARIN SODIUM 100 MG/ML ~~LOC~~ SOLN
1.5000 mg/kg | SUBCUTANEOUS | Status: DC
  Administered 2018-01-04: 100 mg via SUBCUTANEOUS
  Filled 2018-01-03 (×2): qty 1

## 2018-01-03 MED ORDER — SENNA 8.6 MG PO TABS
1.0000 | ORAL_TABLET | Freq: Every day | ORAL | Status: DC
  Administered 2018-01-03 – 2018-01-05 (×3): 8.6 mg via ORAL
  Filled 2018-01-03 (×2): qty 1

## 2018-01-03 MED ORDER — ENSURE ENLIVE PO LIQD
237.0000 mL | Freq: Three times a day (TID) | ORAL | Status: DC
  Administered 2018-01-03 – 2018-01-05 (×3): 237 mL via ORAL

## 2018-01-03 MED ORDER — DEXTROSE 5 % IV SOLN
1.5000 g | INTRAVENOUS | Status: DC
  Filled 2018-01-03: qty 1.5

## 2018-01-03 MED ORDER — OXYCODONE HCL 5 MG PO TABS
10.0000 mg | ORAL_TABLET | ORAL | Status: DC | PRN
  Administered 2018-01-03 – 2018-01-05 (×2): 10 mg via ORAL
  Filled 2018-01-03 (×2): qty 2

## 2018-01-03 NOTE — Progress Notes (Signed)
ANTICOAGULATION CONSULT NOTE   Pharmacy Consult for heparin drip Indication: DVT  No Known Allergies  Patient Measurements: Height: 5\' 10"  (177.8 cm) Weight: 149 lb 8 oz (67.8 kg) IBW/kg (Calculated) : 73 Heparin Dosing Weight: 67.8 kg  Vital Signs: Temp: 98.3 F (36.8 C) (01/14 0536) Temp Source: Oral (01/14 0536) BP: 132/92 (01/14 0536) Pulse Rate: 107 (01/14 0536)  Labs: Recent Labs    01/02/18 0757 01/02/18 0854 01/02/18 1253 01/03/18 0049 01/03/18 0641  HGB 19.1*  --   --  17.8  --   HCT 59.3*  --   --  52.7*  --   PLT 124*  --   --  107*  --   APTT  --   --  40*  --   --   LABPROT  --   --  16.9*  --   --   INR  --   --  1.39  --   --   HEPARINUNFRC  --   --   --  0.56 0.68  CREATININE  --  0.85  --  0.70  --     Estimated Creatinine Clearance: 93 mL/min (by C-G formula based on SCr of 0.7 mg/dL).   Medical History: Past Medical History:  Diagnosis Date  . Cancer ([place]) 12/16/2017   hepatocellular carcinoma  . Carpal tunnel syndrome   . Gallstones   . Gout   . Hepatitis C 11/09/2017  . Hypertension     Medications:  Patient was not on any anticoagulants at home.   Assessment: 62 yo male with recent diagnosis of hepatocellular carcinoma presents with increased abdominal pain and on admission found to have a PE on the left side.    Goal of Therapy:  Heparin level 0.3-0.7 units/ml Monitor platelets by anticoagulation protocol: Yes   Plan:  Give 3400 units bolus x 1 Start heparin infusion at 1150 units/hr Check anti-Xa level in 6 hours and daily while on heparin Continue to monitor H&H and platelets  Baseline APTT, PT INR slightly elevated at baseline likely due to liver disease.   Lendon Ka, PharmD Pharmacy Resident 01/03/2018,7:22 AM    01/14 0100 heparin level 0.56. Continue current regimen. Recheck in 6 hours to confirm.  01/14 0641 HL therapeutic x 1. Continue current rate. Will recheck HL and CBC daily.  Reyann Troop A. Jordan Hawks,  PharmD, BCPS  01/03/18 7:22 AM

## 2018-01-03 NOTE — Care Management Note (Signed)
Case Management Note  Patient Details  Name: Tim Potter MRN: [id number] Date of Birth: [birth year]  Subjective/Objective:   Admitted to [facility] with the diagnosis of pulmonary emboli. Lives alone. Sister is [place]. [phone]). Goes to the [facility] and sees Dr. Janese Banks. [facility] and Medication Management application given 37/29/02                 Action/Plan: Does go to Medication Management for prescriptions to be filled.    Expected Discharge Date:                  Expected Discharge Plan:     In-House Referral:   yes  Discharge planning Services     Post Acute Care Choice:    Choice offered to:     DME Arranged:    DME Agency:     HH Arranged:    HH Agency:     Status of Service:     If discussed at [employer], dates discussed:    Additional Comments:  [name], RN MSN CCM Care Management [phone] 01/03/2018, 1:23 PM

## 2018-01-03 NOTE — Progress Notes (Signed)
[place] [facility] [place] NAME: Tim Potter    MR#:  [id number]  DATE OF BIRTH:  Jul 12, 1956  SUBJECTIVE:  CHIEF COMPLAINT: Patient denies any chest pain or shortness of breath.  Son Tim Potter at bedside  REVIEW OF SYSTEMS:  CONSTITUTIONAL: No fever, fatigue or weakness.  EYES: No blurred or double vision.  EARS, NOSE, AND THROAT: No tinnitus or ear pain.  RESPIRATORY: No cough, shortness of breath, wheezing or hemoptysis.  CARDIOVASCULAR: No chest pain, orthopnea, edema.  GASTROINTESTINAL: No nausea, vomiting, diarrhea or abdominal pain.  GENITOURINARY: No dysuria, hematuria.  ENDOCRINE: No polyuria, nocturia,  HEMATOLOGY: No anemia, easy bruising or bleeding SKIN: No rash or lesion. MUSCULOSKELETAL: No joint pain or arthritis.   NEUROLOGIC: No tingling, numbness, weakness.  PSYCHIATRY: No anxiety or depression.   DRUG ALLERGIES:  No Known Allergies  VITALS:  Blood pressure 129/87, pulse (!) 104, temperature 98.3 F (36.8 C), temperature source Oral, resp. rate 18, height 5\' 10"  (1.778 m), weight 67.8 kg (149 lb 8 oz), SpO2 97 %.  PHYSICAL EXAMINATION:  GENERAL:  62 y.o.-year-old patient lying in the bed with no acute distress.  EYES: Pupils equal, round, reactive to light and accommodation. No scleral icterus. Extraocular muscles intact.  HEENT: Head atraumatic, normocephalic. Oropharynx and nasopharynx clear.  NECK:  Supple, no jugular venous distention. No thyroid enlargement, no tenderness.  LUNGS: Normal breath sounds bilaterally, no wheezing, rales,rhonchi or crepitation. No use of accessory muscles of respiration.  CARDIOVASCULAR: S1, S2 normal. No murmurs, rubs, or gallops.  ABDOMEN: Soft, nontender, nondistended. Bowel sounds present. No organomegaly or mass.  EXTREMITIES: No pedal edema, cyanosis, or clubbing.  NEUROLOGIC: Cranial nerves II through XII are intact. Muscle strength 5/5 in all extremities. Sensation intact.  Gait not checked.  PSYCHIATRIC: The patient is alert and oriented x 3.  SKIN: No obvious rash, lesion, or ulcer.    LABORATORY PANEL:   CBC Recent Labs  Lab 01/03/18 0049  WBC 5.4  HGB 17.8  HCT 52.7*  PLT 107*   ------------------------------------------------------------------------------------------------------------------  Chemistries  Recent Labs  Lab 01/02/18 0854 01/03/18 0049  NA 129* 128*  K 4.0 3.9  CL 96* 96*  CO2 24 26  GLUCOSE 102* 126*  BUN 19 18  CREATININE 0.85 0.70  CALCIUM 9.1 8.2*  AST 80*  --   ALT 24  --   ALKPHOS 134*  --   BILITOT 2.3*  --    ------------------------------------------------------------------------------------------------------------------  Cardiac Enzymes No results for input(s): TROPONINI in the last 168 hours. ------------------------------------------------------------------------------------------------------------------  RADIOLOGY:  Ct Abdomen Pelvis W Contrast  Result Date: 01/02/2018 CLINICAL DATA:  Right-sided abdominal pain for 3 months. Gastric/liver cancer diagnosed 3 months ago. Hepatitis-C. Diverticulitis suspected. EXAM: CT ABDOMEN AND PELVIS WITH CONTRAST TECHNIQUE: Multidetector CT imaging of the abdomen and pelvis was performed using the standard protocol following bolus administration of intravenous contrast. CONTRAST:  35mL ISOVUE-300 IOPAMIDOL (ISOVUE-300) INJECTION 61% COMPARISON:  11/08/2017 MRI.  11/07/2017 CT. FINDINGS: Lower chest: Right base atelectasis. New and enlarged pulmonary nodules, including an index 6 mm right lower lobe pulmonary nodule on image 1/series 4. Normal heart size without pericardial or pleural effusion. Multivessel coronary artery atherosclerosis. Left-sided pulmonary emboli, including on image 3/series 2. Hepatobiliary: Central high right hepatic lobe mass measures 10.5 x 7.9 cm on image 18/series 2 versus 9.1 x 7.8 cm on the prior. Tumor extension into the IVC is new, including on  image 16/series 2. Enlargement of the lateral segment  left liver lobe lesion at 12 mm on image 22/series 2. Progression of moderate intrahepatic biliary duct dilatation, including on image 25/series 2. Gallstones without specific evidence of acute cholecystitis. No common duct dilatation. Pancreas: Normal, without mass or ductal dilatation. Spleen: Normal in size, without focal abnormality. Adrenals/Urinary Tract: Normal adrenal glands. Normal kidneys, without hydronephrosis. Normal urinary bladder. Stomach/Bowel: Normal stomach, without wall thickening. Normal colon, appendix, and terminal ileum. Underdistended proximal jejunum. Vascular/Lymphatic: Aortic and branch vessel atherosclerosis. Progressive portal vein involvement by liver tumor and porta hepatis adenopathy. Thrombus continuing into the superior mesenteric vein including on image 37/series 2. Secondary collaterals within the gastrohepatic ligament and surrounding the distal esophagus. Porta hepatis necrotic adenopathy at 5.4 x 6.7 cm on image 29/series 2 versus 4.3 x 3.7 cm on the prior. New retroperitoneal adenopathy, with a left periaortic node measuring 1.9 cm. No pelvic sidewall adenopathy. Reproductive: Moderate prostatomegaly. Other: New small volume abdominopelvic ascites. Musculoskeletal: Disc bulges at L4-5 and L5-S1. IMPRESSION: 1. Significant progression of disease throughout the abdomen, including within the liver and abdominal nodal stations. 2. Left lower lobe pulmonary emboli, incompletely imaged. 3. Progressive pulmonary metastasis. 4. Progressive vascular extension of tumor, including into the IVC, portal vein, and superior mesenteric veins. 5. Progressive moderate intrahepatic biliary duct dilatation due to mass effect by central tumor and adenopathy. 6. Cholelithiasis. 7. New small volume abdominopelvic ascites. These results were called by telephone at the time of interpretation on 01/02/2018 at 10:52 am to Dr. Conni Slipper , who  verbally acknowledged these results. Electronically Signed   By: Abigail Miyamoto M.D.   On: 01/02/2018 10:55   US Venous Img Lower Bilateral  Result Date: 01/02/2018 CLINICAL DATA:  Pulmonary embolism EXAM: BILATERAL LOWER EXTREMITY VENOUS DUPLEX ULTRASOUND TECHNIQUE: Doppler venous assessment of the bilateral lower extremity deep venous system was performed, including characterization of spectral flow, compressibility, and phasicity. COMPARISON:  None. FINDINGS: There is complete compressibility of the bilateral common femoral, femoral, and popliteal veins. Doppler analysis demonstrates respiratory phasicity and augmentation of flow with calf compression. No obvious superficial vein or calf vein thrombosis. IMPRESSION: No evidence of lower extremity DVT. Electronically Signed   By: Marybelle Killings M.D.   On: 01/02/2018 14:23    EKG:   Orders placed or performed in visit on 11/26/17  . 24 hour holter monitor    ASSESSMENT AND PLAN:    Acute Pulmonary embolism Continue heparin drip, Changed to subcutaneous Lovenox 1.5 mg/kg once daily as recommended by oncology We will train patient and his son with Lovenox shots after Port-A-Cath placement tomorrow  follow PTT.   venous ultrasound of both legs-no DVT  Elevated bilirubin, possible due to hepatocellular cancer.  Hyponatremia.-Could be SIADH  128-sodium Saline lock and fluid restriction and follow-up BMP.  Hepatocellular cancer with metastasis. Oncology consulted- For Port-A-Cath placement by vascular surgery tomorrow for future chemotherapy  palliative care consult.  Thrombocytopenia.  Follow-up CBC and oncologist consult. Platelets are at 10 7000  Hypertension   continue Norvasc, add Imdur for and IV hydralazine as needed.      All the records are reviewed and case discussed with Care Management/Social Workerr. Management plans discussed with the patient, patient's son Tim Potter at bedside and they are in agreement.  CODE  STATUS: fc   TOTAL TIME TAKING CARE OF THIS PATIENT: 41  minutes.   POSSIBLE D/C IN 1-2 DAYS, DEPENDING ON CLINICAL CONDITION.  Note: This dictation was prepared with Dragon dictation along with smaller phrase technology. Any transcriptional errors  that result from this process are unintentional.   [name] M.D on 01/03/2018 at 4:32 PM  Between 7am to 6pm - Pager - [phone] After 6pm go to [email or web address] - password [email or web address] [facility]  Office  [phone]  CC: Primary care physician; Patient, No Pcp Per

## 2018-01-03 NOTE — Consult Note (Signed)
[facility] Vascular Consult Note  MRN : [id number]  Tim Potter is a 62 y.o. (May 06, 1956) male who presents with chief complaint of  Chief Complaint  Patient presents with  . Abdominal Pain  .  History of Present Illness: I am asked by Dr. Janese Banks to see the patient regarding Port-A-Cath placement.  The patient was admitted with abdominal pain and shortness of breath and found to have pulmonary embolus in addition to progressive hepatocellular carcinoma.  He is in need of starting chemotherapy in the very near future, and needs a Port-A-Cath as soon as possible.  In addition, he is in the hospital pulmonary embolus and is on a heparin drip.  Rather than transitioning this to an oral agent and then stopping it, doing it while he is in hospital would be much more helpful.  This is a reasonably new phenomenon and he has had progressive shortness of breath over weeks.  His abdominal discomfort is also severe and worsening over weeks to months.  He has no fevers or chills.  No signs of obvious infection.  Current Facility-Administered Medications  Medication Dose Route Frequency Provider Last Rate Last Dose  . acetaminophen (TYLENOL) tablet 650 mg  650 mg Oral Q6H PRN Demetrios Loll, MD       Or  . acetaminophen (TYLENOL) suppository 650 mg  650 mg Rectal Q6H PRN Demetrios Loll, MD      . albuterol (PROVENTIL) (2.5 MG/3ML) 0.083% nebulizer solution 2.5 mg  2.5 mg Nebulization Q2H PRN Demetrios Loll, MD      . amLODipine (NORVASC) tablet 5 mg  5 mg Oral Daily Demetrios Loll, MD   5 mg at 01/03/18 4174  . bisacodyl (DULCOLAX) EC tablet 5 mg  5 mg Oral Daily PRN Demetrios Loll, MD   5 mg at 01/03/18 0814  . docusate sodium (COLACE) capsule 100 mg  100 mg Oral BID Demetrios Loll, MD   100 mg at 01/03/18 4818  . [START ON 01/04/2018] enoxaparin (LOVENOX) injection 100 mg  1.5 mg/kg Subcutaneous Q24H Gouru, Aruna, MD      . feeding supplement (ENSURE ENLIVE) (ENSURE ENLIVE) liquid 237 mL  237 mL Oral  TID BM Gouru, Aruna, MD   237 mL at 01/03/18 1400  . heparin ADULT infusion 100 units/mL (25000 units/272mL sodium chloride 0.45%)  1,150 Units/hr Intravenous Continuous Gouru, Aruna, MD 11.5 mL/hr at 01/03/18 1200 1,150 Units/hr at 01/03/18 1200  . hydrALAZINE (APRESOLINE) injection 10 mg  10 mg Intravenous Q6H PRN Demetrios Loll, MD      . isosorbide mononitrate (IMDUR) 24 hr tablet 30 mg  30 mg Oral Daily Demetrios Loll, MD   30 mg at 01/03/18 5631  . morphine 4 MG/ML injection 4 mg  4 mg Intravenous Q4H PRN Demetrios Loll, MD   4 mg at 01/02/18 1459  . ondansetron (ZOFRAN) tablet 4 mg  4 mg Oral Q6H PRN Demetrios Loll, MD   4 mg at 01/03/18 4970   Or  . ondansetron [facility]) injection 4 mg  4 mg Intravenous Q6H PRN Demetrios Loll, MD      . oxyCODONE (Oxy IR/ROXICODONE) immediate release tablet 10 mg  10 mg Oral Q4H PRN [name], MD   10 mg at 01/03/18 0831  . polyethylene glycol (MIRALAX / GLYCOLAX) packet 17 g  17 g Oral Daily Gouru, Aruna, MD   17 g at 01/03/18 0831  . senna (SENOKOT) tablet 8.6 mg  1 tablet Oral Daily [name], MD  8.6 mg at 01/03/18 0830  . senna-docusate (Senokot-S) tablet 1 tablet  1 tablet Oral QHS PRN Demetrios Loll, MD        Past Medical History:  Diagnosis Date  . Cancer ([place]) 12/16/2017   hepatocellular carcinoma  . Carpal tunnel syndrome   . Gallstones   . Gout   . Hepatitis C 11/09/2017  . Hypertension     Past Surgical History:  Procedure Laterality Date  . NO PAST SURGERIES      Social History Social History   Tobacco Use  . Smoking status: Light Tobacco Smoker  . Smokeless tobacco: Never Used  Substance Use Topics  . Alcohol use: Yes    Comment: 2-24 oz beer 4X per week  . Drug use: No    Family History Family History  Problem Relation Age of Onset  . Cancer Mother   . CVA Father   No bleeding or clotting disorders   No Known Allergies   REVIEW OF SYSTEMS (Negative unless checked)  Constitutional: [x] Weight loss  [] Fever   [] Chills Cardiac: [] Chest pain   [] Chest pressure   [] Palpitations   [] Shortness of breath when laying flat   [] Shortness of breath at rest   [x] Shortness of breath with exertion. Vascular:  [] Pain in legs with walking   [] Pain in legs at rest   [] Pain in legs when laying flat   [] Claudication   [] Pain in feet when walking  [] Pain in feet at rest  [] Pain in feet when laying flat   [x] History of DVT   [] Phlebitis   [] Swelling in legs   [] Varicose veins   [] Non-healing ulcers Pulmonary:   [] Uses home oxygen   [] Productive cough   [] Hemoptysis   [] Wheeze  [] COPD   [] Asthma Neurologic:  [] Dizziness  [] Blackouts   [] Seizures   [] History of stroke   [] History of TIA  [] Aphasia   [] Temporary blindness   [] Dysphagia   [] Weakness or numbness in arms   [] Weakness or numbness in legs Musculoskeletal:  [x] Arthritis   [] Joint swelling   [] Joint pain   [] Low back pain Hematologic:  [] Easy bruising  [] Easy bleeding   [x] Hypercoagulable state   [] Anemic  [x] Hepatitis Gastrointestinal:  [] Blood in stool   [] Vomiting blood  [x] Gastroesophageal reflux/heartburn   [] Difficulty swallowing. Genitourinary:  [] Chronic kidney disease   [] Difficult urination  [] Frequent urination  [] Burning with urination   [] Blood in urine Skin:  [] Rashes   [] Ulcers   [] Wounds Psychological:  [] History of anxiety   []  History of major depression.  Physical Examination  Vitals:   01/02/18 1535 01/02/18 2021 01/03/18 0536 01/03/18 1428  BP: (!) 134/96 (!) 141/93 (!) 132/92 129/87  Pulse: 97 (!) 105 (!) 107 (!) 104  Resp:  18 16 18   Temp:  98.1 F (36.7 C) 98.3 F (36.8 C) 98.3 F (36.8 C)  TempSrc:  Oral Oral Oral  SpO2:  96% 96% 97%  Weight:      Height:       Body mass index is 21.45 kg/m. Gen:  Cachectic appearing AAM in NAD Head: [place]/AT, + temporalis wasting.  Ear/Nose/Throat: Hearing grossly intact, nares w/o erythema or drainage, oropharynx w/o Erythema/Exudate Eyes: Sclera icteric, conjunctiva injected  Neck: Trachea  midline.  No JVD.  Pulmonary:  Good air movement, respirations not labored, equal bilaterally.  Cardiac: RRR, normal S1, S2 Vascular:  Vessel Right Left  Radial Palpable Palpable  Musculoskeletal: M/S 5/5 throughout.  Extremities without ischemic changes.  No deformity or atrophy. No edema. Neurologic: Sensation grossly intact in extremities.  Symmetrical.  Speech is fluent. Motor exam as listed above. Psychiatric: Judgment intact, Mood & affect appropriate for pt's clinical situation. Dermatologic: No rashes or ulcers noted.  No cellulitis or open wounds.       CBC Lab Results  Component Value Date   WBC 5.4 01/03/2018   HGB 17.8 01/03/2018   HCT 52.7 (H) 01/03/2018   MCV 93.1 01/03/2018   PLT 107 (L) 01/03/2018    BMET    Component Value Date/Time   NA 128 (L) 01/03/2018 0049   NA 132 (L) 03/23/2015 0310   K 3.9 01/03/2018 0049   K 3.3 (L) 03/23/2015 0310   CL 96 (L) 01/03/2018 0049   CL 97 (L) 03/23/2015 0310   CO2 26 01/03/2018 0049   CO2 27 03/23/2015 0310   GLUCOSE 126 (H) 01/03/2018 0049   GLUCOSE 121 (H) 03/23/2015 0310   BUN 18 01/03/2018 0049   BUN 15 03/23/2015 0310   CREATININE 0.70 01/03/2018 0049   CREATININE 1.05 03/23/2015 0310   CALCIUM 8.2 (L) 01/03/2018 0049   CALCIUM 9.1 03/23/2015 0310   GFRNONAA >60 01/03/2018 0049   GFRNONAA >60 03/23/2015 0310   GFRAA >60 01/03/2018 0049   GFRAA >60 03/23/2015 0310   Estimated Creatinine Clearance: 93 mL/min (by C-G formula based on SCr of 0.7 mg/dL).  COAG Lab Results  Component Value Date   INR 1.39 01/02/2018   INR 1.22 11/07/2017    Radiology Ct Abdomen Pelvis W Contrast  Result Date: 01/02/2018 CLINICAL DATA:  Right-sided abdominal pain for 3 months. Gastric/liver cancer diagnosed 3 months ago. Hepatitis-C. Diverticulitis suspected. EXAM: CT ABDOMEN AND PELVIS WITH CONTRAST TECHNIQUE: Multidetector CT imaging of the abdomen and pelvis was performed  using the standard protocol following bolus administration of intravenous contrast. CONTRAST:  23mL ISOVUE-300 IOPAMIDOL (ISOVUE-300) INJECTION 61% COMPARISON:  11/08/2017 MRI.  11/07/2017 CT. FINDINGS: Lower chest: Right base atelectasis. New and enlarged pulmonary nodules, including an index 6 mm right lower lobe pulmonary nodule on image 1/series 4. Normal heart size without pericardial or pleural effusion. Multivessel coronary artery atherosclerosis. Left-sided pulmonary emboli, including on image 3/series 2. Hepatobiliary: Central high right hepatic lobe mass measures 10.5 x 7.9 cm on image 18/series 2 versus 9.1 x 7.8 cm on the prior. Tumor extension into the IVC is new, including on image 16/series 2. Enlargement of the lateral segment left liver lobe lesion at 12 mm on image 22/series 2. Progression of moderate intrahepatic biliary duct dilatation, including on image 25/series 2. Gallstones without specific evidence of acute cholecystitis. No common duct dilatation. Pancreas: Normal, without mass or ductal dilatation. Spleen: Normal in size, without focal abnormality. Adrenals/Urinary Tract: Normal adrenal glands. Normal kidneys, without hydronephrosis. Normal urinary bladder. Stomach/Bowel: Normal stomach, without wall thickening. Normal colon, appendix, and terminal ileum. Underdistended proximal jejunum. Vascular/Lymphatic: Aortic and branch vessel atherosclerosis. Progressive portal vein involvement by liver tumor and porta hepatis adenopathy. Thrombus continuing into the superior mesenteric vein including on image 37/series 2. Secondary collaterals within the gastrohepatic ligament and surrounding the distal esophagus. Porta hepatis necrotic adenopathy at 5.4 x 6.7 cm on image 29/series 2 versus 4.3 x 3.7 cm on the prior. New retroperitoneal adenopathy, with a left periaortic node measuring 1.9 cm. No pelvic sidewall adenopathy. Reproductive: Moderate prostatomegaly. Other: New small volume  abdominopelvic ascites. Musculoskeletal: Disc bulges at L4-5 and L5-S1. IMPRESSION: 1. Significant  progression of disease throughout the abdomen, including within the liver and abdominal nodal stations. 2. Left lower lobe pulmonary emboli, incompletely imaged. 3. Progressive pulmonary metastasis. 4. Progressive vascular extension of tumor, including into the IVC, portal vein, and superior mesenteric veins. 5. Progressive moderate intrahepatic biliary duct dilatation due to mass effect by central tumor and adenopathy. 6. Cholelithiasis. 7. New small volume abdominopelvic ascites. These results were called by telephone at the time of interpretation on 01/02/2018 at 10:52 am to Dr. Conni Slipper , who verbally acknowledged these results. Electronically Signed   By: Abigail Miyamoto M.D.   On: 01/02/2018 10:55   US Venous Img Lower Bilateral  Result Date: 01/02/2018 CLINICAL DATA:  Pulmonary embolism EXAM: BILATERAL LOWER EXTREMITY VENOUS DUPLEX ULTRASOUND TECHNIQUE: Doppler venous assessment of the bilateral lower extremity deep venous system was performed, including characterization of spectral flow, compressibility, and phasicity. COMPARISON:  None. FINDINGS: There is complete compressibility of the bilateral common femoral, femoral, and popliteal veins. Doppler analysis demonstrates respiratory phasicity and augmentation of flow with calf compression. No obvious superficial vein or calf vein thrombosis. IMPRESSION: No evidence of lower extremity DVT. Electronically Signed   By: Marybelle Killings M.D.   On: 01/02/2018 14:23      Assessment/Plan 1.  Hepatocellular carcinoma.  Needs a Port-A-Cath for placement of durable venous access to use for chemotherapy, CT scans, and lab draws.  Risks and benefits discussed with the patient.  Plan to perform tomorrow.  We will hold heparin starting at 8 AM. 2.  Pulmonary embolus.  On a heparin drip.  Doing his Port-A-Cath while he is in hospital on heparin will make this much  more easy to manage. 3.  Hypertension.  Stable on outpatient medications and blood pressure control important in reducing the progression of atherosclerotic disease. On appropriate oral medications.    Leotis Pain, MD  01/03/2018 5:29 PM    This note was created with Dragon medical transcription system.  Any error is purely unintentional

## 2018-01-03 NOTE — Consult Note (Addendum)
Consultation Note Date: 01/03/2018   Patient Name: Tim Potter  DOB: [birth year]  MRN: [id number]  Age / Sex: 62 y.o., male  PCP: Patient, No Pcp Per Referring Physician: [name], MD  Reason for Consultation: Establishing goals of care  HPI/Patient Profile: Tim Potter  is a 62 y.o. male with a known history of liver cancer, gallbladder stone, hepatitis C and hypertension.  The patient presented the ED with above chief complaints.  He has had abdominal pain majorly on the right side for the past 3 months.  He has decreased appetite and weight loss.  He was recently diagnosed with hepatocellular carcinoma.  The CAT scan of the abdomen show metastasis and PE on the left side.    Clinical Assessment and Goals of Care: Mr. Shiflet is resting in bed. He states is has 3 children.    This NP Asencion Gowda reviewed medical records, assessed the patient and then meet at the patient's bedside and discussed diagnosis, prognosis, GOC, EOL wishes disposition and options.  A detailed discussion was had today regarding advanced directives.  Concepts specific to code status, artifical feeding and hydration, continued IV antibiotics and rehospitalization was had.  The difference between an aggressive medical intervention path  and a hospice comfort care path for this patient at this time was had.  Values and goals of care important to patient and family were attempted to be elicited. The natural trajectory and expectations at EOL were discussed.  Questions and concerns addressed.     He states he and his family have talked and he wants to do everything possible as long as possible. He states there is nothing he can think of as far as his quality of life that would cause him to want to stop chemotherapy. He is unsure if he would ever want a feeding tube, ventilator, and CPR. He states he wants to live as long as  possible. He states he needs to be here for his family. He was encouraged to discuss his goals and wishes with his family for planning so that they would be able to make decisions for him if needed.   He is amenable to palliative medicine to follow outpatient to continue conversations and to assist with symptom management as needed.     SUMMARY OF RECOMMENDATIONS     Recommend palliative to follow outpatient.   Code Status/Advance Care Planning:  Full code   Palliative Prophylaxis:   Oral Care   Prognosis:   Depending on chemo treatment.   Discharge Planning: To Be Determined      Primary Diagnoses: Present on Admission: . Pulmonary emboli ([place])   I have reviewed the medical record, interviewed the patient and family, and examined the patient. The following aspects are pertinent.  Past Medical History:  Diagnosis Date  . Cancer ([place]) 12/16/2017   hepatocellular carcinoma  . Carpal tunnel syndrome   . Gallstones   . Gout   . Hepatitis C 11/09/2017  . Hypertension    Social History   Socioeconomic  History  . Marital status: Single    Spouse name: None  . Number of children: None  . Years of education: None  . Highest education level: None  Social Needs  . Financial resource strain: None  . Food insecurity - worry: None  . Food insecurity - inability: None  . Transportation needs - medical: None  . Transportation needs - non-medical: None  Occupational History  . None  Tobacco Use  . Smoking status: Light Tobacco Smoker  . Smokeless tobacco: Never Used  Substance and Sexual Activity  . Alcohol use: Yes    Comment: 2-24 oz beer 4X per week  . Drug use: No  . Sexual activity: None  Other Topics Concern  . None  Social History Narrative  . None   Family History  Problem Relation Age of Onset  . Cancer Mother   . CVA Father    Scheduled Meds: . amLODipine  5 mg Oral Daily  . docusate sodium  100 mg Oral BID  . [START ON 01/04/2018] enoxaparin  (LOVENOX) injection  1.5 mg/kg Subcutaneous Q24H  . feeding supplement (ENSURE ENLIVE)  237 mL Oral TID BM  . isosorbide mononitrate  30 mg Oral Daily  . polyethylene glycol  17 g Oral Daily  . senna  1 tablet Oral Daily   Continuous Infusions: . sodium chloride 75 mL/hr at 01/02/18 1455  . heparin 1,150 Units/hr (01/03/18 1200)   PRN Meds:.acetaminophen **OR** acetaminophen, albuterol, bisacodyl, hydrALAZINE, morphine injection, ondansetron **OR** ondansetron (ZOFRAN) IV, oxyCODONE, senna-docusate Medications Prior to Admission:  Prior to Admission medications   Medication Sig Start Date End Date Taking? Authorizing Provider  amLODipine (NORVASC) 5 MG tablet Take 1 tablet (5 mg total) by mouth daily. 11/09/17  Yes Gladstone Lighter, MD  Lenvatinib 12 MG Daily Dose 4 (3) MG CPPK Take 12 mg by mouth daily. 11/09/17  Yes Gladstone Lighter, MD  oxyCODONE (OXY IR/ROXICODONE) 5 MG immediate release tablet Take 1 tablet (5 mg total) by mouth every 4 (four) hours as needed for moderate pain, severe pain or breakthrough pain. 12/10/17  Yes Sindy Guadeloupe, MD  ondansetron (ZOFRAN) 4 MG tablet Take 1 tablet (4 mg total) by mouth every 6 (six) hours as needed for nausea or vomiting. Patient not taking: Reported on 12/16/2017 11/30/17   Sindy Guadeloupe, MD   No Known Allergies Review of Systems  All other systems reviewed and are negative.   Physical Exam  Constitutional: No distress.  Pulmonary/Chest: Effort normal.  Skin: Skin is warm and dry.    Vital Signs: BP (!) 132/92 (BP Location: Right Arm)   Pulse (!) 107   Temp 98.3 F (36.8 C) (Oral)   Resp 16   Ht 5\' 10"  (1.778 m)   Wt 67.8 kg (149 lb 8 oz)   SpO2 96%   BMI 21.45 kg/m  Pain Assessment: 0-10   Pain Score: Asleep   SpO2: SpO2: 96 % O2 Device:SpO2: 96 % O2 Flow Rate: .   IO: Intake/output summary:   Intake/Output Summary (Last 24 hours) at 01/03/2018 1402 Last data filed at 01/03/2018 0340 Gross per 24 hour  Intake  1034.26 ml  Output -  Net 1034.26 ml    LBM: Last BM Date: 12/30/17 Baseline Weight: Weight: 72.6 kg (160 lb) Most recent weight: Weight: 67.8 kg (149 lb 8 oz)     Palliative Assessment/Data: 60%     Time In: 1:10 Time Out: 2:00 Time Total: 50 min Greater than 50%  of  this time was spent counseling and coordinating care related to the above assessment and plan.  Signed by: Asencion Gowda, NP   Please contact Palliative Medicine Team phone at [phone] for questions and concerns.  For individual provider: See Shea Evans

## 2018-01-03 NOTE — Consult Note (Addendum)
Hematology/Oncology Consult note [facility] [phone] [phone]  Patient Care Team: Patient, No Pcp Per as PCP - General (General Practice)   Name of the patient: Tim Potter  [id number]  1956/11/09    Reason for consult: metastatic [facility]   Requesting physician: Dr. 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  He was found to have portocaval adenopathy, large centrally obstructing liver mass as well as pulmonary metastases at the time of diagnosis.  No overt cirrhosis seen on imaging all the labs suggestive of chronic liver disease.  He was started on lenvatinib on 11/18/2017 and was tolerating it well.  Bilirubin was trending down from 6-2.5.  AFP had been stable to trending the down between 1300-1100.    Patient presented with worsening abdominal pain to the ER and underwent a repeat CT abdomen and pelvis with contrast which showed significant disease progression throughout the abdomen including the liver and the abdominal nodal stations.  Progressive pulmonary metastases and vascular extension of the tumor into the IVC portal vein and superior mesenteric veins.  Moderate intrahepatic biliary ductal dilatation due to mass-effect by central tumor.  Left lower lobe pulmonary emboli incompletely imaged.  Currently patient reports that his abdominal pain is still uncontrolled and rates it about a 6 out of 10.  He has not had a bowel movement for the last 1 week.  ECOG PS- 1  Pain scale- 6   Review of systems- Review of Systems  Constitutional: Positive for malaise/fatigue. Negative for chills, fever and weight loss.  HENT: Negative for congestion, ear discharge and nosebleeds.   Eyes: Negative for blurred vision.  Respiratory: Negative for cough, hemoptysis, sputum production, shortness of  breath and wheezing.   Cardiovascular: Negative for chest pain, palpitations, orthopnea and claudication.  Gastrointestinal: Positive for abdominal pain and constipation. Negative for blood in stool, diarrhea, heartburn, melena, nausea and vomiting.  Genitourinary: Negative for dysuria, flank pain, frequency, hematuria and urgency.  Musculoskeletal: Negative for back pain, joint pain and myalgias.  Skin: Negative for rash.  Neurological: Negative for dizziness, tingling, focal weakness, seizures, weakness and headaches.  Endo/Heme/Allergies: Does not bruise/bleed easily.  Psychiatric/Behavioral: Negative for depression and suicidal ideas. The patient does not have insomnia.     No Known Allergies  Patient Active Problem List   Diagnosis Date Noted  . Pulmonary emboli ([place]) 01/02/2018  . Hepatocellular carcinoma ([place]) 12/16/2017  . Goals of care, counseling/discussion 11/16/2017  . Liver mass 11/07/2017     Past Medical History:  Diagnosis Date  . Cancer ([place]) 12/16/2017   hepatocellular carcinoma  . Carpal tunnel syndrome   . Gallstones   . Gout   . Hepatitis C 11/09/2017  . Hypertension      Past Surgical History:  Procedure Laterality Date  . NO PAST SURGERIES      Social History   Socioeconomic History  . Marital status: Single    Spouse name: Not on file  . Number of children: Not on file  . Years of education: Not on file  . Highest education level: Not on file  Social Needs  . Financial resource strain: Not on file  . Food insecurity - worry: Not on file  . Food insecurity - inability: Not on file  . Transportation needs - medical: Not on file  . Transportation needs - non-medical: Not on file  Occupational History  . Not on file  Tobacco Use  . Smoking status: Light Tobacco Smoker  . Smokeless tobacco: Never Used  Substance and Sexual Activity  . Alcohol use: Yes    Comment: 2-24 oz beer 4X per week  . Drug use: No  . Sexual activity: Not on file    Other Topics Concern  . Not on file  Social History Narrative  . Not on file     Family History  Problem Relation Age of Onset  . Cancer Mother   . CVA Father      Current Facility-Administered Medications:  .  0.9 %  sodium chloride infusion, , Intravenous, Continuous, Demetrios Loll, MD, Last Rate: 75 mL/hr at 01/02/18 1455 .  acetaminophen (TYLENOL) tablet 650 mg, 650 mg, Oral, Q6H PRN **OR** acetaminophen (TYLENOL) suppository 650 mg, 650 mg, Rectal, Q6H PRN, Demetrios Loll, MD .  albuterol (PROVENTIL) (2.5 MG/3ML) 0.083% nebulizer solution 2.5 mg, 2.5 mg, Nebulization, Q2H PRN, Demetrios Loll, MD .  amLODipine (NORVASC) tablet 5 mg, 5 mg, Oral, Daily, Demetrios Loll, MD, 5 mg at 01/03/18 6195 .  bisacodyl (DULCOLAX) EC tablet 5 mg, 5 mg, Oral, Daily PRN, Demetrios Loll, MD, 5 mg at 01/03/18 0932 .  docusate sodium (COLACE) capsule 100 mg, 100 mg, Oral, BID, Demetrios Loll, MD, 100 mg at 01/03/18 6712 .  heparin ADULT infusion 100 units/mL (25000 units/254mL sodium chloride 0.45%), 1,150 Units/hr, Intravenous, Continuous, Lifsey, Betti Cruz, RPH, Last Rate: 11.5 mL/hr at 01/02/18 1803, 1,150 Units/hr at 01/02/18 1803 .  hydrALAZINE (APRESOLINE) injection 10 mg, 10 mg, Intravenous, Q6H PRN, Demetrios Loll, MD .  isosorbide mononitrate (IMDUR) 24 hr tablet 30 mg, 30 mg, Oral, Daily, Demetrios Loll, MD, 30 mg at 01/03/18 4580 .  morphine 4 MG/ML injection 4 mg, 4 mg, Intravenous, Q4H PRN, Demetrios Loll, MD, 4 mg at 01/02/18 1459 .  ondansetron (ZOFRAN) tablet 4 mg, 4 mg, Oral, Q6H PRN, 4 mg at 01/03/18 0832 **OR** ondansetron (ZOFRAN) injection 4 mg, 4 mg, Intravenous, Q6H PRN, Demetrios Loll, MD .  oxyCODONE (Oxy IR/ROXICODONE) immediate release tablet 10 mg, 10 mg, Oral, Q4H PRN, Gouru, Aruna, MD, 10 mg at 01/03/18 0831 .  polyethylene glycol (MIRALAX / GLYCOLAX) packet 17 g, 17 g, Oral, Daily, Gouru, Aruna, MD, 17 g at 01/03/18 0831 .  senna (SENOKOT) tablet 8.6 mg, 1 tablet, Oral, Daily, Gouru, Aruna, MD, 8.6 mg at  01/03/18 0830 .  senna-docusate (Senokot-S) tablet 1 tablet, 1 tablet, Oral, QHS PRN, Demetrios Loll, MD   Physical exam:  Vitals:   01/02/18 1220 01/02/18 1535 01/02/18 2021 01/03/18 0536  BP: (!) 150/108 (!) 134/96 (!) 141/93 (!) 132/92  Pulse: 100 97 (!) 105 (!) 107  Resp: (!) 24  18 16   Temp: 97.7 F (36.5 C)  98.1 F (36.7 C) 98.3 F (36.8 C)  TempSrc: Oral  Oral Oral  SpO2: 97%  96% 96%  Weight: 149 lb 8 oz (67.8 kg)     Height: 5\' 10"  (1.778 m)      Physical Exam  Constitutional: He is oriented to person, place, and time and well-developed, well-nourished, and in no distress.  HENT:  Head: Normocephalic and atraumatic.  Eyes: EOM are normal. Pupils are equal, round, and reactive to light.  Mild scleral icterus  Neck: Normal range of motion.  Cardiovascular: Normal rate, regular rhythm and normal heart sounds.  Pulmonary/Chest: Effort normal and breath sounds normal.  Abdominal: Soft. Bowel sounds are normal.  Tenderness to palpation  in the right upper quadrant  Neurological: He is alert and oriented to person, place, and time.  Skin: Skin is warm and dry.       CMP Latest Ref Rng & Units 01/03/2018  Glucose 65 - 99 mg/dL 126(H)  BUN 6 - 20 mg/dL 18  Creatinine 0.61 - 1.24 mg/dL 0.70  Sodium 135 - 145 mmol/L 128(L)  Potassium 3.5 - 5.1 mmol/L 3.9  Chloride 101 - 111 mmol/L 96(L)  CO2 22 - 32 mmol/L 26  Calcium 8.9 - 10.3 mg/dL 8.2(L)  Total Protein 6.5 - 8.1 g/dL -  Total Bilirubin 0.3 - 1.2 mg/dL -  Alkaline Phos 38 - 126 U/L -  AST 15 - 41 U/L -  ALT 17 - 63 U/L -   CBC Latest Ref Rng & Units 01/03/2018  WBC 3.8 - 10.6 K/uL 5.4  Hemoglobin 13.0 - 18.0 g/dL 17.8  Hematocrit 40.0 - 52.0 % 52.7(H)  Platelets 150 - 440 K/uL 107(L)    @IMAGES @  Ct Abdomen Pelvis W Contrast  Result Date: 01/02/2018 CLINICAL DATA:  Right-sided abdominal pain for 3 months. Gastric/liver cancer diagnosed 3 months ago. Hepatitis-C. Diverticulitis suspected. EXAM: CT ABDOMEN AND  PELVIS WITH CONTRAST TECHNIQUE: Multidetector CT imaging of the abdomen and pelvis was performed using the standard protocol following bolus administration of intravenous contrast. CONTRAST:  81mL ISOVUE-300 IOPAMIDOL (ISOVUE-300) INJECTION 61% COMPARISON:  11/08/2017 MRI.  11/07/2017 CT. FINDINGS: Lower chest: Right base atelectasis. New and enlarged pulmonary nodules, including an index 6 mm right lower lobe pulmonary nodule on image 1/series 4. Normal heart size without pericardial or pleural effusion. Multivessel coronary artery atherosclerosis. Left-sided pulmonary emboli, including on image 3/series 2. Hepatobiliary: Central high right hepatic lobe mass measures 10.5 x 7.9 cm on image 18/series 2 versus 9.1 x 7.8 cm on the prior. Tumor extension into the IVC is new, including on image 16/series 2. Enlargement of the lateral segment left liver lobe lesion at 12 mm on image 22/series 2. Progression of moderate intrahepatic biliary duct dilatation, including on image 25/series 2. Gallstones without specific evidence of acute cholecystitis. No common duct dilatation. Pancreas: Normal, without mass or ductal dilatation. Spleen: Normal in size, without focal abnormality. Adrenals/Urinary Tract: Normal adrenal glands. Normal kidneys, without hydronephrosis. Normal urinary bladder. Stomach/Bowel: Normal stomach, without wall thickening. Normal colon, appendix, and terminal ileum. Underdistended proximal jejunum. Vascular/Lymphatic: Aortic and branch vessel atherosclerosis. Progressive portal vein involvement by liver tumor and porta hepatis adenopathy. Thrombus continuing into the superior mesenteric vein including on image 37/series 2. Secondary collaterals within the gastrohepatic ligament and surrounding the distal esophagus. Porta hepatis necrotic adenopathy at 5.4 x 6.7 cm on image 29/series 2 versus 4.3 x 3.7 cm on the prior. New retroperitoneal adenopathy, with a left periaortic node measuring 1.9 cm. No pelvic  sidewall adenopathy. Reproductive: Moderate prostatomegaly. Other: New small volume abdominopelvic ascites. Musculoskeletal: Disc bulges at L4-5 and L5-S1. IMPRESSION: 1. Significant progression of disease throughout the abdomen, including within the liver and abdominal nodal stations. 2. Left lower lobe pulmonary emboli, incompletely imaged. 3. Progressive pulmonary metastasis. 4. Progressive vascular extension of tumor, including into the IVC, portal vein, and superior mesenteric veins. 5. Progressive moderate intrahepatic biliary duct dilatation due to mass effect by central tumor and adenopathy. 6. Cholelithiasis. 7. New small volume abdominopelvic ascites. These results were called by telephone at the time of interpretation on 01/02/2018 at 10:52 am to Dr. Conni Slipper , who verbally acknowledged these results. Electronically Signed   By: Marylyn Ishihara  Jobe Igo M.D.   On: 01/02/2018 10:55   US Venous Img Lower Bilateral  Result Date: 01/02/2018 CLINICAL DATA:  Pulmonary embolism EXAM: BILATERAL LOWER EXTREMITY VENOUS DUPLEX ULTRASOUND TECHNIQUE: Doppler venous assessment of the bilateral lower extremity deep venous system was performed, including characterization of spectral flow, compressibility, and phasicity. COMPARISON:  None. FINDINGS: There is complete compressibility of the bilateral common femoral, femoral, and popliteal veins. Doppler analysis demonstrates respiratory phasicity and augmentation of flow with calf compression. No obvious superficial vein or calf vein thrombosis. IMPRESSION: No evidence of lower extremity DVT. Electronically Signed   By: Marybelle Killings M.D.   On: 01/02/2018 14:23    Assessment and plan- Patient is a 62 y.o. male with metastatic [place] with metastases to the lymph node and lung with progression on lenvatinib  I have personally reviewed the CT abdomen images independently and I the findings with the patient.  Patient has been on lenvatinib for only 1-1/2 months now but  unfortunately scans show significant progression in his central liver mass, portocaval adenopathy as well as pulmonary metastases.  I discussed that metastatic [place] carries a poor prognosis and the fact that he has progressed so quickly on lenvatinib indicates aggressive biological behavior of his tumor.  Options at this time would be either hospice/best supportive care versus trial of opdivo.  Patient wants to continue to try second line of treatment and is agreeable to start opdivo as an outpatient  I discussed risks and benefits of opdivo including all but not limited to risk of autoimmune colitis, hepatitis, pneumonitis and risk of kidney dysfunction and other autoimmune disorders.  Patient understands and agrees to proceed.  He will start opdivo as an outpatient but he will need port placement for the same.  See plan below  Port placement: I did touch base with Dr. Lucky Cowboy and he will get in touch with Dr. Delana Meyer for port placement as an inpatient tomorrow.  He needs to be n.p.o. after midnight for the same.  He will continue to be on heparin drip which needs to be stopped before the procedure and he can go back on heparin drip or Lovenox postprocedure  Newly diagnosed pulmonary embolism: This was seen on CT abdomen and and patient has not had a dedicated CT chest.  Patient does not appear to be short of breath and is saturating well on room air.  It would be reasonable to obtain a dedicated CT chest at this time to take a look at his liver lesions as well as assess his PE better.  I would continue heparin drip for 1 more day in anticipation of port placement tomorrow and then switch him to Lovenox 1.5 mg/kg once a day.  Given history of underlying liver disease I would hold off on using newer anticoagulants  Neoplasm related abdominal pain.  Please increase his as needed oxycodone dose to 10 mg p.o. every 4 as needed.  I will add a long-acting pain medicine as an outpatient if need be  Opioid associated  constipation: I would recommend starting him on MiraLAX and senna for the same.  He has not had a bowel movement in over a week   Thank you for this kind referral and the opportunity to participate in the care of this patient   Visit Diagnosis 1. Pain of upper abdomen   2. Other acute pulmonary embolism without acute cor pulmonale (HCC)   3. Pulmonary emboli (HCC)     Dr. Randa Evens, MD, MPH [place] [facility]  [facility] Pager- [phone] 01/03/2018  11:12 AM

## 2018-01-04 ENCOUNTER — Encounter
Admission: EM | Disposition: A | Discharge status: Home / Self Care | Payer: Self-pay | Source: Home / Self Care | Attending: Internal Medicine

## 2018-01-04 ENCOUNTER — Encounter: Payer: Self-pay | Admitting: Vascular Surgery

## 2018-01-04 DIAGNOSIS — C220 Liver cell carcinoma: Secondary | ICD-10-CM | POA: Diagnosis not present

## 2018-01-04 HISTORY — PX: PORTA CATH INSERTION: CATH118285

## 2018-01-04 LAB — BASIC METABOLIC PANEL
ANION GAP: 8 (ref 5–15)
BUN: 15 mg/dL (ref 6–20)
CALCIUM: 8.4 mg/dL — AB (ref 8.9–10.3)
CO2: 23 mmol/L (ref 22–32)
CREATININE: 0.77 mg/dL (ref 0.61–1.24)
Chloride: 98 mmol/L — ABNORMAL LOW (ref 101–111)
GFR calc Af Amer: >60 mL/min (ref >60)
GFR calc non Af Amer: >60 mL/min (ref >60)
Glucose, Bld: 106 mg/dL — ABNORMAL HIGH (ref 65–99)
Potassium: 4.3 mmol/L (ref 3.5–5.1)
SODIUM: 129 mmol/L — AB (ref 135–145)

## 2018-01-04 LAB — CBC
HCT: 51.2 % (ref 40.0–52.0)
HEMOGLOBIN: 16.9 g/dL (ref 13.0–18.0)
MCH: 31.2 pg (ref 26.0–34.0)
MCHC: 32.9 g/dL (ref 32.0–36.0)
MCV: 94.6 fL (ref 80.0–100.0)
PLATELETS: 100 10*3/uL — AB (ref 150–440)
RBC: 5.41 MIL/uL (ref 4.40–5.90)
RDW: 16.1 % — ABNORMAL HIGH (ref 11.5–14.5)
WBC: 6 10*3/uL (ref 3.8–10.6)

## 2018-01-04 LAB — HEPARIN LEVEL (UNFRACTIONATED): Heparin Unfractionated: 0.57 IU/mL (ref 0.30–0.70)

## 2018-01-04 SURGERY — PORTA CATH INSERTION
Anesthesia: Moderate Sedation

## 2018-01-04 MED ORDER — FENTANYL CITRATE (PF) 100 MCG/2ML IJ SOLN
INTRAMUSCULAR | Status: AC
  Filled 2018-01-04: qty 2

## 2018-01-04 MED ORDER — FENTANYL CITRATE (PF) 100 MCG/2ML IJ SOLN
INTRAMUSCULAR | Status: DC | PRN
  Administered 2018-01-04: 50 ug via INTRAVENOUS

## 2018-01-04 MED ORDER — SODIUM CHLORIDE 0.9 % IV SOLN: INTRAVENOUS | Status: DC

## 2018-01-04 MED ORDER — DEXTROSE 5 % IV SOLN
INTRAVENOUS | Status: AC
  Administered 2018-01-04: 13:00:00
  Filled 2018-01-04: qty 1.5

## 2018-01-04 MED ORDER — MIDAZOLAM HCL 5 MG/5ML IJ SOLN
INTRAMUSCULAR | Status: AC
  Filled 2018-01-04: qty 5

## 2018-01-04 MED ORDER — MIDAZOLAM HCL 2 MG/2ML IJ SOLN
INTRAMUSCULAR | Status: DC | PRN
  Administered 2018-01-04: 2 mg via INTRAVENOUS

## 2018-01-04 MED ORDER — HEPARIN (PORCINE) IN NACL 2-0.9 UNIT/ML-% IJ SOLN
INTRAMUSCULAR | Status: AC
  Filled 2018-01-04: qty 500

## 2018-01-04 MED ORDER — LIDOCAINE-EPINEPHRINE (PF) 1 %-1:200000 IJ SOLN
INTRAMUSCULAR | Status: AC
  Filled 2018-01-04: qty 30

## 2018-01-04 SURGICAL SUPPLY — 8 items
DRAPE INCISE IOBAN 66X45 STRL (DRAPES) ×3 IMPLANT
GAUZE SPONGE 4X4 12PLY STRL (GAUZE/BANDAGES/DRESSINGS) ×3 IMPLANT
KIT PORT POWER 8FR ISP CVUE (Miscellaneous) ×3 IMPLANT
NEEDLE ENTRY 21GA 7CM ECHOTIP (NEEDLE) ×3 IMPLANT
PACK ANGIOGRAPHY (CUSTOM PROCEDURE TRAY) ×3 IMPLANT
SET INTRO CAPELLA COAXIAL (SET/KITS/TRAYS/PACK) ×3 IMPLANT
SUT MNCRL AB 4-0 PS2 18 (SUTURE) ×3 IMPLANT
SUTURE VIC 3-0 (SUTURE) ×3 IMPLANT

## 2018-01-04 NOTE — Progress Notes (Signed)
ANTICOAGULATION CONSULT NOTE   Pharmacy Consult for heparin drip Indication: DVT  No Known Allergies  Patient Measurements: Height: 5\' 10"  (177.8 cm) Weight: 149 lb 8 oz (67.8 kg) IBW/kg (Calculated) : 73 Heparin Dosing Weight: 67.8 kg  Vital Signs: Temp: 98.9 F (37.2 C) (01/15 0454) Temp Source: Oral (01/15 0454) BP: 129/92 (01/15 0454) Pulse Rate: 107 (01/15 0454)  Labs: Recent Labs    01/02/18 0757 01/02/18 0854 01/02/18 1253 01/03/18 0049 01/03/18 0641 01/04/18 0611  HGB 19.1*  --   --  17.8  --  16.9  HCT 59.3*  --   --  52.7*  --  51.2  PLT 124*  --   --  107*  --  100*  APTT  --   --  40*  --   --   --   LABPROT  --   --  16.9*  --   --   --   INR  --   --  1.39  --   --   --   HEPARINUNFRC  --   --   --  0.56 0.68 0.57  CREATININE  --  0.85  --  0.70  --  0.77    Estimated Creatinine Clearance: 93 mL/min (by C-G formula based on SCr of 0.77 mg/dL).   Medical History: Past Medical History:  Diagnosis Date  . Cancer ([place]) 12/16/2017   hepatocellular carcinoma  . Carpal tunnel syndrome   . Gallstones   . Gout   . Hepatitis C 11/09/2017  . Hypertension     Medications:  Patient was not on any anticoagulants at home.   Assessment: 62 yo male with recent diagnosis of hepatocellular carcinoma presents with increased abdominal pain and on admission found to have a PE on the left side.    Goal of Therapy:  Heparin level 0.3-0.7 units/ml Monitor platelets by anticoagulation protocol: Yes   Plan: 01/14 0100 heparin level 0.56. Continue current regimen. Recheck in 6 hours to confirm.  01/14 0641 HL therapeutic x 1. Continue current rate. Will recheck HL and CBC daily.  01/15 @ 0611 HL level therapeutic. Continue current rate. Heparin drip set to stop at 0800 for port placement. Will F/U on drip restart and order labs accordingly.   Pernell Dupre, PharmD, BCPS Clinical Pharmacist 01/04/2018 7:25 AM

## 2018-01-04 NOTE — H&P (Signed)
[facility] History & Physical Update  The patient was interviewed and re-examined.  The patient's previous History and Physical has been reviewed and is unchanged.  There is no change in the plan of care. We plan to proceed with the scheduled procedure.  Hortencia Pilar, MD  01/04/2018, 1:23 PM

## 2018-01-04 NOTE — Progress Notes (Signed)
[facility] [place] NAME: Tim Potter    MR#:  [id number]  DATE OF BIRTH:  1956/02/04  SUBJECTIVE:  CHIEF COMPLAINT: Patient denies any chest pain or shortness of breath.  Son Tim Potter at bedside.  Had Port-A-Cath placement today late afternoon  REVIEW OF SYSTEMS:  CONSTITUTIONAL: No fever, fatigue or weakness.  EYES: No blurred or double vision.  EARS, NOSE, AND THROAT: No tinnitus or ear pain.  RESPIRATORY: No cough, shortness of breath, wheezing or hemoptysis.  CARDIOVASCULAR: No chest pain, orthopnea, edema.  GASTROINTESTINAL: No nausea, vomiting, diarrhea or abdominal pain.  GENITOURINARY: No dysuria, hematuria.  ENDOCRINE: No polyuria, nocturia,  HEMATOLOGY: No anemia, easy bruising or bleeding SKIN: No rash or lesion. MUSCULOSKELETAL: No joint pain or arthritis.   NEUROLOGIC: No tingling, numbness, weakness.  PSYCHIATRY: No anxiety or depression.   DRUG ALLERGIES:  No Known Allergies  VITALS:  Blood pressure (!) 126/95, pulse (!) 114, temperature 98 F (36.7 C), temperature source Oral, resp. rate 18, height 5\' 10"  (1.778 m), weight 67.6 kg (149 lb), SpO2 94 %.  PHYSICAL EXAMINATION:  GENERAL:  62 y.o.-year-old patient lying in the bed with no acute distress.  EYES: Pupils equal, round, reactive to light and accommodation. No scleral icterus. Extraocular muscles intact.  HEENT: Head atraumatic, normocephalic. Oropharynx and nasopharynx clear.  NECK:  Supple, no jugular venous distention. right side of the chest with Port-A-Cath .  LUNGS: Normal breath sounds bilaterally, no wheezing, rales,rhonchi or crepitation. No use of accessory muscles of respiration.  CARDIOVASCULAR: S1, S2 normal. No murmurs, rubs, or gallops.  ABDOMEN: Soft, nontender, nondistended. Bowel sounds present.  EXTREMITIES: No pedal edema, cyanosis, or clubbing.  NEUROLOGIC: Cranial nerves II through XII are intact. Muscle strength 5/5 in all  extremities. Sensation intact. Gait not checked.  PSYCHIATRIC: The patient is alert and oriented x 3.  SKIN: No obvious rash, lesion, or ulcer.    LABORATORY PANEL:   CBC Recent Labs  Lab 01/04/18 0611  WBC 6.0  HGB 16.9  HCT 51.2  PLT 100*   ------------------------------------------------------------------------------------------------------------------  Chemistries  Recent Labs  Lab 01/02/18 0854  01/04/18 0611  NA 129*   < > 129*  K 4.0   < > 4.3  CL 96*   < > 98*  CO2 24   < > 23  GLUCOSE 102*   < > 106*  BUN 19   < > 15  CREATININE 0.85   < > 0.77  CALCIUM 9.1   < > 8.4*  AST 80*  --   --   ALT 24  --   --   ALKPHOS 134*  --   --   BILITOT 2.3*  --   --    < > = values in this interval not displayed.   ------------------------------------------------------------------------------------------------------------------  Cardiac Enzymes No results for input(s): TROPONINI in the last 168 hours. ------------------------------------------------------------------------------------------------------------------  RADIOLOGY:  No results found.  EKG:   Orders placed or performed in visit on 11/26/17  . 24 hour holter monitor    ASSESSMENT AND PLAN:    Acute Pulmonary embolism  discontinued heparin drip prior to the Port-A-Cath placement Changed to subcutaneous Lovenox 1.5 mg/kg once daily as recommended by oncology We will train patient and his son with Lovenox shots after Port-A-Cath placement   follow PTT.   venous ultrasound of both legs-no DVT  Elevated bilirubin, possible due to hepatocellular cancer.  Hyponatremia.-Could be SIADH  128-sodium--129 Saline lock and  fluid restriction and follow-up BMP.  Hepatocellular cancer with metastasis. Oncology consulted- For Port-A-Cath placement by vascular surgery tomorrow for future chemotherapy  palliative care consult.  Thrombocytopenia.  Follow-up CBC and oncologist consult. Platelets are at 10  7000--100,000  Hypertension   continue Norvasc, add Imdur for and IV hydralazine as needed.      All the records are reviewed and case discussed with Care Management/Social Workerr. Management plans discussed with the patient, patient's son Tim Potter at bedside and they are in agreement.  CODE STATUS: fc   TOTAL TIME TAKING CARE OF THIS PATIENT: 41  minutes.   POSSIBLE D/C IN 1- DAYS, DEPENDING ON CLINICAL CONDITION.  Note: This dictation was prepared with Dragon dictation along with smaller phrase technology. Any transcriptional errors that result from this process are unintentional.   Tim Potter M.D on 01/04/2018 at 4:47 PM  Between 7am to 6pm - Pager - [phone] After 6pm go to [email or web address] - password [email or web address] [facility]  Office  [phone]  CC: Primary care physician; Patient, No Pcp Per

## 2018-01-04 NOTE — Op Note (Signed)
OPERATIVE NOTE   PROCEDURE: 1. Placement of a right IJ Infuse-a-Port  PRE-OPERATIVE DIAGNOSIS: Metastatic hepatocellular carcinoma  POST-OPERATIVE DIAGNOSIS: Same  SURGEON: Katha Cabal M.D.  ANESTHESIA: Conscious sedation was administered under my direct supervision by the interventional radiology RN. IV Versed plus fentanyl were utilized. Continuous ECG, pulse oximetry and blood pressure was monitored throughout the entire procedure. Conscious sedation was for a total of 32 minutes.  ESTIMATED BLOOD LOSS: Minimal   FINDING(S): 1.  Patent vein  SPECIMEN(S): None  INDICATIONS:   Tim Potter is a 62 y.o. male who presents with metastatic hepatocellular carcinoma.  DESCRIPTION: After obtaining full informed written consent, the patient was brought back to the special procedure suite and placed in the supine position. The patient's right neck and chest wall are prepped and draped in sterile fashion. Appropriate timeout was called.  Ultrasound is placed in a sterile sleeve, ultrasound is utilized to avoid vascular injury as well as secondary to lack of appropriate landmarks. The right internal jugular vein is identified. It is echolucent and homogeneous as well as easily compressible indicating patency. An image is recorded for the permanent record.  Access to the vein with a micropuncture needle is done under direct ultrasound visualization.  1% lidocaine is infiltrated into the soft tissue at the base of the neck as well as on the chest wall.  Under direct ultrasound visualization a micro-needle is inserted into the vein followed by the micro-wire. Micro-sheath was then advanced and a J wire is inserted without difficulty under fluoroscopic guidance. A small counterincision was created at the wire insertion site. A transverse incision is created 2 fingerbreadths below the scapula and a pocket is fashioned using both blunt and sharp dissection. The pocket is tested for appropriate  size with the hub of the Infuse-a-Port. The tunneling device is then used to pull the intravascular portion of the catheter from the pocket to the neck counterincision.  Dilator and peel-away sheath were then inserted over the wire and the wire is removed. Catheter is then advanced into the venous system without difficulty. Peel-away sheath was then removed.  Catheter is then positioned under fluoroscopic guidance at the atrial caval junction. It is then transected connected to the hub and the hope is slipped into the subcutaneous pocket on the chest wall. The hub was then accessed percutaneously and aspirates easily and flushes well and is flushed with 30 cc of heparinized saline. The pocket incision is then closed in layers using interrupted 3-0 Vicryl for the subcutaneous tissues and 4-0 Monocryl subcuticular for skin closure. Dermabond is applied. The neck counterincision was closed with 4-0 Monocryl subcuticular and Dermabond as well.  The patient tolerated the procedure well and there were no immediate complications.  COMPLICATIONS: None  CONDITION: Unchanged  Katha Cabal M.D. [facility] Office: [phone]   01/04/2018, 2:29 PM

## 2018-01-05 LAB — SODIUM: Sodium: 132 mmol/L — ABNORMAL LOW (ref 135–145)

## 2018-01-05 MED ORDER — MEGESTROL ACETATE 20 MG PO TABS
40.0000 mg | ORAL_TABLET | Freq: Every day | ORAL | Status: DC
  Filled 2018-01-05: qty 1

## 2018-01-05 MED ORDER — ENOXAPARIN SODIUM 100 MG/ML ~~LOC~~ SOLN: 1.5000 mg/kg | SUBCUTANEOUS | 0 refills | Status: DC

## 2018-01-05 MED ORDER — SENNOSIDES-DOCUSATE SODIUM 8.6-50 MG PO TABS: 1.0000 | ORAL_TABLET | Freq: Every evening | ORAL | Status: AC | PRN

## 2018-01-05 MED ORDER — MEGESTROL ACETATE 40 MG PO TABS: 40.0000 mg | ORAL_TABLET | Freq: Two times a day (BID) | ORAL | 0 refills | Status: AC

## 2018-01-05 MED ORDER — ENSURE ENLIVE PO LIQD: 237.0000 mL | Freq: Three times a day (TID) | ORAL | 1 refills | Status: AC

## 2018-01-05 MED ORDER — ISOSORBIDE MONONITRATE ER 30 MG PO TB24: 30.0000 mg | ORAL_TABLET | Freq: Every day | ORAL | 0 refills | Status: AC

## 2018-01-05 MED ORDER — DOCUSATE SODIUM 100 MG PO CAPS: 100.0000 mg | ORAL_CAPSULE | Freq: Two times a day (BID) | ORAL | 0 refills | Status: DC

## 2018-01-05 NOTE — Care Management (Signed)
Discharge to home today per Dr. Margaretmary Eddy. Tim Potter is in agreement to home palliative in the home. [name], [facility] updated. Son, Tim Potter, is at the bedside. States that his father will be going to his home post discharge. [name] RN MSN CCM Care Management [phone]

## 2018-01-05 NOTE — Progress Notes (Signed)
Discharge instructions along with home medications and follow up gone over with patient and son. Both verbalize that they understood instructions. Prescriptions given to patient's son. IV's removed. Pt being discharged home on room air, no distress noted. Ammie Dalton, RN

## 2018-01-05 NOTE — Patient Instructions (Signed)
Nivolumab injection What is this medicine? NIVOLUMAB (nye VOL ue mab) is a monoclonal antibody. It is used to treat melanoma, lung cancer, kidney cancer, head and neck cancer, Hodgkin lymphoma, urothelial cancer, colon cancer, and liver cancer. This medicine may be used for other purposes; ask your health care provider or pharmacist if you have questions. COMMON BRAND NAME(S): Opdivo What should I tell my health care provider before I take this medicine? They need to know if you have any of these conditions: -diabetes -immune system problems -kidney disease -liver disease -lung disease -organ transplant -stomach or intestine problems -thyroid disease -an unusual or allergic reaction to nivolumab, other medicines, foods, dyes, or preservatives -pregnant or trying to get pregnant -breast-feeding How should I use this medicine? This medicine is for infusion into a vein. It is given by a health care professional in a hospital or clinic setting. A special MedGuide will be given to you before each treatment. Be sure to read this information carefully each time. Talk to your pediatrician regarding the use of this medicine in children. While this drug may be prescribed for children as young as 12 years for selected conditions, precautions do apply. Overdosage: If you think you have taken too much of this medicine contact a poison control center or emergency room at once. NOTE: This medicine is only for you. Do not share this medicine with others. What if I miss a dose? It is important not to miss your dose. Call your doctor or health care professional if you are unable to keep an appointment. What may interact with this medicine? Interactions have not been studied. Give your health care provider a list of all the medicines, herbs, non-prescription drugs, or dietary supplements you use. Also tell them if you smoke, drink alcohol, or use illegal drugs. Some items may interact with your  medicine. This list may not describe all possible interactions. Give your health care provider a list of all the medicines, herbs, non-prescription drugs, or dietary supplements you use. Also tell them if you smoke, drink alcohol, or use illegal drugs. Some items may interact with your medicine. What should I watch for while using this medicine? This drug may make you feel generally unwell. Continue your course of treatment even though you feel ill unless your doctor tells you to stop. You may need blood work done while you are taking this medicine. Do not become pregnant while taking this medicine or for 5 months after stopping it. Women should inform their doctor if they wish to become pregnant or think they might be pregnant. There is a potential for serious side effects to an unborn child. Talk to your health care professional or pharmacist for more information. Do not breast-feed an infant while taking this medicine. What side effects may I notice from receiving this medicine? Side effects that you should report to your doctor or health care professional as soon as possible: -allergic reactions like skin rash, itching or hives, swelling of the face, lips, or tongue -black, tarry stools -blood in the urine -bloody or watery diarrhea -changes in vision -change in sex drive -changes in emotions or moods -chest pain -confusion -cough -decreased appetite -diarrhea -facial flushing -feeling faint or lightheaded -fever, chills -hair loss -hallucination, loss of contact with reality -headache -irritable -joint pain -loss of memory -muscle pain -muscle weakness -seizures -shortness of breath -signs and symptoms of high blood sugar such as dizziness; dry mouth; dry skin; fruity breath; nausea; stomach pain; increased hunger or thirst; increased   urination -signs and symptoms of kidney injury like trouble passing urine or change in the amount of urine -signs and symptoms of liver injury  like dark yellow or brown urine; general ill feeling or flu-like symptoms; light-colored stools; loss of appetite; nausea; right upper belly pain; unusually weak or tired; yellowing of the eyes or skin -stiff neck -swelling of the ankles, feet, hands -weight gain Side effects that usually do not require medical attention (report to your doctor or health care professional if they continue or are bothersome): -bone pain -constipation -tiredness -vomiting This list may not describe all possible side effects. Call your doctor for medical advice about side effects. You may report side effects to FDA at 1-800-FDA-1088. Where should I keep my medicine? This drug is given in a hospital or clinic and will not be stored at home. NOTE: This sheet is a summary. It may not cover all possible information. If you have questions about this medicine, talk to your doctor, pharmacist, or health care provider.  2018 Elsevier/Gold Standard (2016-09-14 17:49:34)  

## 2018-01-05 NOTE — Progress Notes (Signed)
Hematology/Oncology Consult note [facility]  Telephone:([phone] [phone]  Patient Care Team: Patient, No Pcp Per as PCP - General (General Practice)   Name of the patient: Boen Sterbenz  [id number]  [birth year]    Interval history- patient is resting comfortably. Son is at bedside  ECOG PS- 1-2 Pain scale- 4  Review of systems- Review of Systems  Constitutional: Positive for malaise/fatigue. Negative for chills, fever and weight loss.  HENT: Negative for congestion, ear discharge and nosebleeds.   Eyes: Negative for blurred vision.  Respiratory: Negative for cough, hemoptysis, sputum production, shortness of breath and wheezing.   Cardiovascular: Negative for chest pain, palpitations, orthopnea and claudication.  Gastrointestinal: Positive for abdominal pain. Negative for blood in stool, constipation, diarrhea, heartburn, melena, nausea and vomiting.  Genitourinary: Negative for dysuria, flank pain, frequency, hematuria and urgency.  Musculoskeletal: Negative for back pain, joint pain and myalgias.  Skin: Negative for rash.  Neurological: Negative for dizziness, tingling, focal weakness, seizures, weakness and headaches.  Endo/Heme/Allergies: Does not bruise/bleed easily.  Psychiatric/Behavioral: Negative for depression and suicidal ideas. The patient does not have insomnia.     No Known Allergies   Past Medical History:  Diagnosis Date  . Cancer ([place]) 12/16/2017   hepatocellular carcinoma  . Carpal tunnel syndrome   . Gallstones   . Gout   . Hepatitis C 11/09/2017  . Hypertension      Past Surgical History:  Procedure Laterality Date  . NO PAST SURGERIES    . PORTA CATH INSERTION N/A 01/04/2018   Procedure: PORTA CATH INSERTION;  Surgeon: Katha Cabal, MD;  Location: [facility];  Service: Cardiovascular;  Laterality: N/A;    Social History   Socioeconomic History  . Marital status: Single    Spouse  name: Not on file  . Number of children: Not on file  . Years of education: Not on file  . Highest education level: Not on file  Social Needs  . Financial resource strain: Not on file  . Food insecurity - worry: Not on file  . Food insecurity - inability: Not on file  . Transportation needs - medical: Not on file  . Transportation needs - non-medical: Not on file  Occupational History  . Not on file  Tobacco Use  . Smoking status: Light Tobacco Smoker  . Smokeless tobacco: Never Used  Substance and Sexual Activity  . Alcohol use: Yes    Comment: 2-24 oz beer 4X per week  . Drug use: No  . Sexual activity: Not on file  Other Topics Concern  . Not on file  Social History Narrative  . Not on file    Family History  Problem Relation Age of Onset  . Cancer Mother   . CVA Father      Current Facility-Administered Medications:  .  acetaminophen (TYLENOL) tablet 650 mg, 650 mg, Oral, Q6H PRN **OR** acetaminophen (TYLENOL) suppository 650 mg, 650 mg, Rectal, Q6H PRN, Demetrios Loll, MD .  albuterol (PROVENTIL) (2.5 MG/3ML) 0.083% nebulizer solution 2.5 mg, 2.5 mg, Nebulization, Q2H PRN, Demetrios Loll, MD .  amLODipine (NORVASC) tablet 5 mg, 5 mg, Oral, Daily, Demetrios Loll, MD, 5 mg at 01/04/18 9629 .  bisacodyl (DULCOLAX) EC tablet 5 mg, 5 mg, Oral, Daily PRN, Demetrios Loll, MD, 5 mg at 01/04/18 5284 .  docusate sodium (COLACE) capsule 100 mg, 100 mg, Oral, BID, Demetrios Loll, MD, 100 mg at 01/04/18 2031 .  enoxaparin (LOVENOX) injection 100  mg, 1.5 mg/kg, Subcutaneous, Q24H, Gouru, Aruna, MD, 100 mg at 01/04/18 1945 .  feeding supplement (ENSURE ENLIVE) (ENSURE ENLIVE) liquid 237 mL, 237 mL, Oral, TID BM, Gouru, Aruna, MD, 237 mL at 01/04/18 2031 .  hydrALAZINE (APRESOLINE) injection 10 mg, 10 mg, Intravenous, Q6H PRN, Demetrios Loll, MD .  isosorbide mononitrate (IMDUR) 24 hr tablet 30 mg, 30 mg, Oral, Daily, Demetrios Loll, MD, 30 mg at 01/04/18 9458 .  morphine 4 MG/ML injection 4 mg, 4 mg,  Intravenous, Q4H PRN, Demetrios Loll, MD, 4 mg at 01/05/18 0011 .  ondansetron (ZOFRAN) tablet 4 mg, 4 mg, Oral, Q6H PRN, 4 mg at 01/03/18 0832 **OR** ondansetron (ZOFRAN) injection 4 mg, 4 mg, Intravenous, Q6H PRN, Demetrios Loll, MD .  oxyCODONE (Oxy IR/ROXICODONE) immediate release tablet 10 mg, 10 mg, Oral, Q4H PRN, Gouru, Aruna, MD, 10 mg at 01/03/18 0831 .  polyethylene glycol (MIRALAX / GLYCOLAX) packet 17 g, 17 g, Oral, Daily, Gouru, Aruna, MD, 17 g at 01/03/18 0831 .  senna (SENOKOT) tablet 8.6 mg, 1 tablet, Oral, Daily, Gouru, Aruna, MD, 8.6 mg at 01/04/18 0821 .  senna-docusate (Senokot-S) tablet 1 tablet, 1 tablet, Oral, QHS PRN, Demetrios Loll, MD  Physical exam:  Vitals:   01/04/18 1430 01/04/18 1458 01/04/18 2037 01/05/18 0401  BP: (!) 137/99 (!) 126/95 123/89 (!) 134/94  Pulse: (!) 116 (!) 114 (!) 121 (!) 106  Resp:  18 13 20   Temp:  98 F (36.7 C) 97.6 F (36.4 C) 97.9 F (36.6 C)  TempSrc:  Oral Oral Oral  SpO2: 100% 94% 97% 95%  Weight:      Height:       Physical Exam  Constitutional: He is oriented to person, place, and time and well-developed, well-nourished, and in no distress.  HENT:  Head: Normocephalic and atraumatic.  Eyes: EOM are normal. Pupils are equal, round, and reactive to light.  Neck: Normal range of motion.  Cardiovascular: Normal rate, regular rhythm and normal heart sounds.  Pulmonary/Chest: Effort normal and breath sounds normal.  Abdominal: Soft. Bowel sounds are normal. There is tenderness (RUQ).  Neurological: He is alert and oriented to person, place, and time.  Skin: Skin is warm and dry.     CMP Latest Ref Rng & Units 01/05/2018  Glucose 65 - 99 mg/dL -  BUN 6 - 20 mg/dL -  Creatinine 0.61 - 1.24 mg/dL -  Sodium 135 - 145 mmol/L 132(L)  Potassium 3.5 - 5.1 mmol/L -  Chloride 101 - 111 mmol/L -  CO2 22 - 32 mmol/L -  Calcium 8.9 - 10.3 mg/dL -  Total Protein 6.5 - 8.1 g/dL -  Total Bilirubin 0.3 - 1.2 mg/dL -  Alkaline Phos 38 - 126 U/L -    AST 15 - 41 U/L -  ALT 17 - 63 U/L -   CBC Latest Ref Rng & Units 01/04/2018  WBC 3.8 - 10.6 K/uL 6.0  Hemoglobin 13.0 - 18.0 g/dL 16.9  Hematocrit 40.0 - 52.0 % 51.2  Platelets 150 - 440 K/uL 100(L)    @IMAGES @  Ct Abdomen Pelvis W Contrast  Result Date: 01/02/2018 CLINICAL DATA:  Right-sided abdominal pain for 3 months. Gastric/liver cancer diagnosed 3 months ago. Hepatitis-C. Diverticulitis suspected. EXAM: CT ABDOMEN AND PELVIS WITH CONTRAST TECHNIQUE: Multidetector CT imaging of the abdomen and pelvis was performed using the standard protocol following bolus administration of intravenous contrast. CONTRAST:  70m ISOVUE-300 IOPAMIDOL (ISOVUE-300) INJECTION 61% COMPARISON:  11/08/2017 MRI.  11/07/2017 CT. FINDINGS:  Lower chest: Right base atelectasis. New and enlarged pulmonary nodules, including an index 6 mm right lower lobe pulmonary nodule on image 1/series 4. Normal heart size without pericardial or pleural effusion. Multivessel coronary artery atherosclerosis. Left-sided pulmonary emboli, including on image 3/series 2. Hepatobiliary: Central high right hepatic lobe mass measures 10.5 x 7.9 cm on image 18/series 2 versus 9.1 x 7.8 cm on the prior. Tumor extension into the IVC is new, including on image 16/series 2. Enlargement of the lateral segment left liver lobe lesion at 12 mm on image 22/series 2. Progression of moderate intrahepatic biliary duct dilatation, including on image 25/series 2. Gallstones without specific evidence of acute cholecystitis. No common duct dilatation. Pancreas: Normal, without mass or ductal dilatation. Spleen: Normal in size, without focal abnormality. Adrenals/Urinary Tract: Normal adrenal glands. Normal kidneys, without hydronephrosis. Normal urinary bladder. Stomach/Bowel: Normal stomach, without wall thickening. Normal colon, appendix, and terminal ileum. Underdistended proximal jejunum. Vascular/Lymphatic: Aortic and branch vessel atherosclerosis.  Progressive portal vein involvement by liver tumor and porta hepatis adenopathy. Thrombus continuing into the superior mesenteric vein including on image 37/series 2. Secondary collaterals within the gastrohepatic ligament and surrounding the distal esophagus. Porta hepatis necrotic adenopathy at 5.4 x 6.7 cm on image 29/series 2 versus 4.3 x 3.7 cm on the prior. New retroperitoneal adenopathy, with a left periaortic node measuring 1.9 cm. No pelvic sidewall adenopathy. Reproductive: Moderate prostatomegaly. Other: New small volume abdominopelvic ascites. Musculoskeletal: Disc bulges at L4-5 and L5-S1. IMPRESSION: 1. Significant progression of disease throughout the abdomen, including within the liver and abdominal nodal stations. 2. Left lower lobe pulmonary emboli, incompletely imaged. 3. Progressive pulmonary metastasis. 4. Progressive vascular extension of tumor, including into the IVC, portal vein, and superior mesenteric veins. 5. Progressive moderate intrahepatic biliary duct dilatation due to mass effect by central tumor and adenopathy. 6. Cholelithiasis. 7. New small volume abdominopelvic ascites. These results were called by telephone at the time of interpretation on 01/02/2018 at 10:52 am to Dr. Conni Slipper , who verbally acknowledged these results. Electronically Signed   By: Abigail Miyamoto M.D.   On: 01/02/2018 10:55   US Venous Img Lower Bilateral  Result Date: 01/02/2018 CLINICAL DATA:  Pulmonary embolism EXAM: BILATERAL LOWER EXTREMITY VENOUS DUPLEX ULTRASOUND TECHNIQUE: Doppler venous assessment of the bilateral lower extremity deep venous system was performed, including characterization of spectral flow, compressibility, and phasicity. COMPARISON:  None. FINDINGS: There is complete compressibility of the bilateral common femoral, femoral, and popliteal veins. Doppler analysis demonstrates respiratory phasicity and augmentation of flow with calf compression. No obvious superficial vein or calf  vein thrombosis. IMPRESSION: No evidence of lower extremity DVT. Electronically Signed   By: Marybelle Killings M.D.   On: 01/02/2018 14:23     Assessment and plan- Patient is a 62 y.o. 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 I will see him next week and start opdivo. Port placement today. I met with son at the bedside and explained patients overall poor prognosis and future treatment plan. Patient likely to be moving in with his son  2. PE- patient should be discharged on lovenox 1.5 mg/kg [place] once daily (he is unlikely to do BID dosing)   Visit Diagnosis 1. Pain of upper abdomen   2. Other acute pulmonary embolism without acute cor pulmonale ([place])   3. Pulmonary emboli ([facility])      Dr. Randa Evens, MD, MPH [facility] [facility] Pager- [phone] 01/05/2018 7:03 AM

## 2018-01-05 NOTE — Discharge Summary (Signed)
Tim Potter at [place] NAME: Tim Potter    MR#:  [id number]  DATE OF BIRTH:  12/05/1956  DATE OF ADMISSION:  01/02/2018 ADMITTING PHYSICIAN: Demetrios Loll, MD  DATE OF DISCHARGE:  01/05/18  PRIMARY CARE PHYSICIAN: Patient, No Pcp Per    ADMISSION DIAGNOSIS:  Pain of upper abdomen [R10.10] Other acute pulmonary embolism without acute cor pulmonale (HCC) [I26.99]  DISCHARGE DIAGNOSIS:  Active Problems:   Pulmonary emboli (HCC)   SECONDARY DIAGNOSIS:   Past Medical History:  Diagnosis Date  . Cancer ([place]) 12/16/2017   hepatocellular carcinoma  . Carpal tunnel syndrome   . Gallstones   . Gout   . Hepatitis C 11/09/2017  . Hypertension     HOSPITAL COURSE:   HPI  Blas Tim Potter  is a 62 y.o. male with a known history of liver cancer, gallbladder stone, hepatitis C and hypertension.  The patient presented the ED with above chief complaints.  He has had abdominal pain majorly on the right side for the past 3 months.  He has decreased appetite and weight loss.  He was recently diagnosed with hepatocellular carcinoma.  The CAT scan of the abdomen show metastasis and PE on the left side.  Acute Pulmonary embolism  discontinued heparin drip prior to the Port-A-Cath placement Changed to subcutaneous Lovenox 1.5 mg/kg once daily as recommended by oncology We will train patient and his son with Lovenox shots after Port-A-Cath placement   follow PTT.   venous ultrasound of both legs-no DVT  Elevated bilirubin, possible due to hepatocellular cancer.  Hyponatremia.-Could be SIADH 128-sodium--129 Saline lock and fluid restriction and follow-up BMP.  Hepatocellular cancer with metastasis. Oncology consulted- For Port-A-Cath placement by vascular surgery tomorrow for future chemotherapy  palliative care consult.  Thrombocytopenia. Follow-up CBC and oncologist consult. Platelets are at 10 7000--100,000  Hypertension   continue  Norvasc,addImdur for and IV hydralazine as needed.  Constipation resolved Continue stool softeners    DISCHARGE CONDITIONS:   Stable   CONSULTS OBTAINED:  Treatment Team:  Sindy Guadeloupe, MD   PROCEDURES  PORT -A - CATH placement  DRUG ALLERGIES:  No Known Allergies  DISCHARGE MEDICATIONS:   Allergies as of 01/05/2018   No Known Allergies     Medication List    STOP taking these medications   Lenvatinib 12 mg daily dose 4 (3) MG capsule Commonly known as:  [place] these medications   amLODipine 5 MG tablet Commonly known as:  NORVASC Take 1 tablet (5 mg total) by mouth daily.   docusate sodium 100 MG capsule Commonly known as:  COLACE Take 1 capsule (100 mg total) by mouth 2 (two) times daily.   enoxaparin 100 MG/ML injection Commonly known as:  LOVENOX Inject 1 mL (100 mg total) into the skin daily.   feeding supplement (ENSURE ENLIVE) Liqd Take 237 mLs by mouth 3 (three) times daily between meals.   isosorbide mononitrate 30 MG 24 hr tablet Commonly known as:  IMDUR Take 1 tablet (30 mg total) by mouth daily. Start taking on:  01/06/2018   megestrol 40 MG tablet Commonly known as:  MEGACE Take 1 tablet (40 mg total) by mouth 2 (two) times daily.   ondansetron 4 MG tablet Commonly known as:  ZOFRAN Take 1 tablet (4 mg total) by mouth every 6 (six) hours as needed for nausea or vomiting.   oxyCODONE 5 MG immediate release tablet Commonly known as:  Oxy  IR/ROXICODONE Take 1 tablet (5 mg total) by mouth every 4 (four) hours as needed for moderate pain, severe pain or breakthrough pain.   senna-docusate 8.6-50 MG tablet Commonly known as:  Senokot-S Take 1 tablet by mouth at bedtime as needed for mild constipation.        DISCHARGE INSTRUCTIONS:   Follow-up with primary care physician in a week Follow-up with oncology next week as recommended Patient needs outpatient palliative care follow-up-in 1 week  DIET:  Cardiac  diet  DISCHARGE CONDITION:  Fair  ACTIVITY:  Activity as tolerated  OXYGEN:  Home Oxygen: No.   Oxygen Delivery: room air  DISCHARGE LOCATION:  home   If you experience worsening of your admission symptoms, develop shortness of breath, life threatening emergency, suicidal or homicidal thoughts you must seek medical attention immediately by calling 911 or calling your MD immediately  if symptoms less severe.  You Must read complete instructions/literature along with all the possible adverse reactions/side effects for all the Medicines you take and that have been prescribed to you. Take any new Medicines after you have completely understood and accpet all the possible adverse reactions/side effects.   Please note  You were cared for by a hospitalist during your hospital stay. If you have any questions about your discharge medications or the care you received while you were in the hospital after you are discharged, you can call the unit and asked to speak with the hospitalist on call if the hospitalist that took care of you is not available. Once you are discharged, your primary care physician will handle any further medical issues. Please note that NO REFILLS for any discharge medications will be authorized once you are discharged, as it is imperative that you return to your primary care physician (or establish a relationship with a primary care physician if you do not have one) for your aftercare needs so that they can reassess your need for medications and monitor your lab values.     Today  Chief Complaint  Patient presents with  . Abdominal Pain   Patient is feeling much better.  Son and patient were trained to give Lovenox shots on a daily basis.  Okay to discharge patient from vascular and oncology standpoint  ROS:  CONSTITUTIONAL: Denies fevers, chills. Denies any fatigue, weakness.  EYES: Denies blurry vision, double vision, eye pain. EARS, NOSE, THROAT: Denies tinnitus,  ear pain, hearing loss. RESPIRATORY: Denies cough, wheeze, shortness of breath.  CARDIOVASCULAR: Denies chest pain, palpitations, edema.  GASTROINTESTINAL: Denies nausea, vomiting, diarrhea, abdominal pain. Denies bright red blood per rectum. GENITOURINARY: Denies dysuria, hematuria. ENDOCRINE: Denies nocturia or thyroid problems. HEMATOLOGIC AND LYMPHATIC: Denies easy bruising or bleeding. SKIN: Denies rash or lesion. MUSCULOSKELETAL: Denies pain in neck, back, shoulder, knees, hips or arthritic symptoms.  NEUROLOGIC: Denies paralysis, paresthesias.  PSYCHIATRIC: Denies anxiety or depressive symptoms.   VITAL SIGNS:  Blood pressure 123/83, pulse (!) 112, temperature 97.9 F (36.6 C), temperature source Oral, resp. rate 20, height 5\' 10"  (1.778 m), weight 67.6 kg (149 lb), SpO2 96 %.  I/O:    Intake/Output Summary (Last 24 hours) at 01/05/2018 1344 Last data filed at 01/05/2018 0900 Gross per 24 hour  Intake 120 ml  Output -  Net 120 ml    PHYSICAL EXAMINATION:  GENERAL:  62 y.o.-year-old patient lying in the bed with no acute distress.  EYES: Pupils equal, round, reactive to light and accommodation. No scleral icterus. Extraocular muscles intact.  HEENT: Head atraumatic, normocephalic. Oropharynx  and nasopharynx clear.  NECK:  Supple, no jugular venous distention. No thyroid enlargement, no tenderness.  LUNGS: Normal breath sounds bilaterally, no wheezing, rales,rhonchi or crepitation. No use of accessory muscles of respiration.  CARDIOVASCULAR: Right anterior chest wall with Port-A-Cath S1, S2 normal. No murmurs, rubs, or gallops.  ABDOMEN: Soft, non-tender, non-distended. Bowel sounds present. No organomegaly or mass.  EXTREMITIES: No pedal edema, cyanosis, or clubbing.  NEUROLOGIC: Cranial nerves II through XII are intact. Muscle strength 5/5 in all extremities. Sensation intact. Gait not checked.  PSYCHIATRIC: The patient is alert and oriented x 3.  SKIN: No obvious rash,  lesion, or ulcer.   DATA REVIEW:   CBC Recent Labs  Lab 01/04/18 0611  WBC 6.0  HGB 16.9  HCT 51.2  PLT 100*    Chemistries  Recent Labs  Lab 01/02/18 0854  01/04/18 0611 01/05/18 0342  NA 129*   < > 129* 132*  K 4.0   < > 4.3  --   CL 96*   < > 98*  --   CO2 24   < > 23  --   GLUCOSE 102*   < > 106*  --   BUN 19   < > 15  --   CREATININE 0.85   < > 0.77  --   CALCIUM 9.1   < > 8.4*  --   AST 80*  --   --   --   ALT 24  --   --   --   ALKPHOS 134*  --   --   --   BILITOT 2.3*  --   --   --    < > = values in this interval not displayed.    Cardiac Enzymes No results for input(s): TROPONINI in the last 168 hours.  Microbiology Results  Results for orders placed or performed during the hospital encounter of 11/07/17  Culture, blood (routine x 2)     Status: None   Collection Time: 11/07/17  5:27 PM  Result Value Ref Range Status   Specimen Description BLOOD LEFT ANTECUBITAL  Final   Special Requests   Final    BOTTLES DRAWN AEROBIC AND ANAEROBIC Blood Culture adequate volume   Culture NO GROWTH 5 DAYS  Final   Report Status 11/12/2017 FINAL  Final  Culture, blood (routine x 2)     Status: None   Collection Time: 11/07/17  5:28 PM  Result Value Ref Range Status   Specimen Description BLOOD RIGHT ANTECUBITAL  Final   Special Requests   Final    BOTTLES DRAWN AEROBIC AND ANAEROBIC Blood Culture adequate volume   Culture NO GROWTH 5 DAYS  Final   Report Status 11/12/2017 FINAL  Final    RADIOLOGY:  Ct Abdomen Pelvis W Contrast  Result Date: 01/02/2018 CLINICAL DATA:  Right-sided abdominal pain for 3 months. Gastric/liver cancer diagnosed 3 months ago. Hepatitis-C. Diverticulitis suspected. EXAM: CT ABDOMEN AND PELVIS WITH CONTRAST TECHNIQUE: Multidetector CT imaging of the abdomen and pelvis was performed using the standard protocol following bolus administration of intravenous contrast. CONTRAST:  22mL ISOVUE-300 IOPAMIDOL (ISOVUE-300) INJECTION 61% COMPARISON:   11/08/2017 MRI.  11/07/2017 CT. FINDINGS: Lower chest: Right base atelectasis. New and enlarged pulmonary nodules, including an index 6 mm right lower lobe pulmonary nodule on image 1/series 4. Normal heart size without pericardial or pleural effusion. Multivessel coronary artery atherosclerosis. Left-sided pulmonary emboli, including on image 3/series 2. Hepatobiliary: Central high right hepatic lobe mass measures 10.5 x 7.9  cm on image 18/series 2 versus 9.1 x 7.8 cm on the prior. Tumor extension into the IVC is new, including on image 16/series 2. Enlargement of the lateral segment left liver lobe lesion at 12 mm on image 22/series 2. Progression of moderate intrahepatic biliary duct dilatation, including on image 25/series 2. Gallstones without specific evidence of acute cholecystitis. No common duct dilatation. Pancreas: Normal, without mass or ductal dilatation. Spleen: Normal in size, without focal abnormality. Adrenals/Urinary Tract: Normal adrenal glands. Normal kidneys, without hydronephrosis. Normal urinary bladder. Stomach/Bowel: Normal stomach, without wall thickening. Normal colon, appendix, and terminal ileum. Underdistended proximal jejunum. Vascular/Lymphatic: Aortic and branch vessel atherosclerosis. Progressive portal vein involvement by liver tumor and porta hepatis adenopathy. Thrombus continuing into the superior mesenteric vein including on image 37/series 2. Secondary collaterals within the gastrohepatic ligament and surrounding the distal esophagus. Porta hepatis necrotic adenopathy at 5.4 x 6.7 cm on image 29/series 2 versus 4.3 x 3.7 cm on the prior. New retroperitoneal adenopathy, with a left periaortic node measuring 1.9 cm. No pelvic sidewall adenopathy. Reproductive: Moderate prostatomegaly. Other: New small volume abdominopelvic ascites. Musculoskeletal: Disc bulges at L4-5 and L5-S1. IMPRESSION: 1. Significant progression of disease throughout the abdomen, including within the liver  and abdominal nodal stations. 2. Left lower lobe pulmonary emboli, incompletely imaged. 3. Progressive pulmonary metastasis. 4. Progressive vascular extension of tumor, including into the IVC, portal vein, and superior mesenteric veins. 5. Progressive moderate intrahepatic biliary duct dilatation due to mass effect by central tumor and adenopathy. 6. Cholelithiasis. 7. New small volume abdominopelvic ascites. These results were called by telephone at the time of interpretation on 01/02/2018 at 10:52 am to Dr. Conni Slipper , who verbally acknowledged these results. Electronically Signed   By: Abigail Miyamoto M.D.   On: 01/02/2018 10:55   US Venous Img Lower Bilateral  Result Date: 01/02/2018 CLINICAL DATA:  Pulmonary embolism EXAM: BILATERAL LOWER EXTREMITY VENOUS DUPLEX ULTRASOUND TECHNIQUE: Doppler venous assessment of the bilateral lower extremity deep venous system was performed, including characterization of spectral flow, compressibility, and phasicity. COMPARISON:  None. FINDINGS: There is complete compressibility of the bilateral common femoral, femoral, and popliteal veins. Doppler analysis demonstrates respiratory phasicity and augmentation of flow with calf compression. No obvious superficial vein or calf vein thrombosis. IMPRESSION: No evidence of lower extremity DVT. Electronically Signed   By: Marybelle Killings M.D.   On: 01/02/2018 14:23    EKG:   Orders placed or performed in visit on 11/26/17  . 24 hour holter monitor      Management plans discussed with the patient, family and they are in agreement.  CODE STATUS:     Code Status Orders  (From admission, onward)        Start     Ordered   01/02/18 1216  Full code  Continuous     01/02/18 1215    Code Status History    Date Active Date Inactive Code Status Order ID Comments User Context   11/07/2017 17:13 11/09/2017 15:34 Full Code [id number]  Loletha Grayer, MD ED      TOTAL TIME TAKING CARE OF THIS PATIENT: 45  minutes.    Note: This dictation was prepared with Dragon dictation along with smaller phrase technology. Any transcriptional errors that result from this process are unintentional.   @[facility] @  on 01/05/2018 at 1:44 PM  Between 7am to 6pm - Pager - [phone]  After 6pm go to [email or web address] - [facility]  Office  [phone]  CC: Primary care physician; Patient, No Pcp Per

## 2018-01-05 NOTE — Discharge Instructions (Signed)
Follow-up with primary care physician in a week Follow-up with oncology next week as recommended Patient needs outpatient palliative care follow-up-in 1 week Continue taking Lovenox shots daily basis

## 2018-01-05 NOTE — Progress Notes (Signed)
New referral for out patient palliative to follow at home received from [facility]. Plan is for discharge home today. Patient information faxed to referral. [name] RN, BSN, [facility] and [facility], hospital Liaison [phone]

## 2018-01-05 NOTE — Progress Notes (Signed)
Pt given soap suds enema and tolerated well. No BM yet. Will continue to monitor. Ammie Dalton, RN

## 2018-01-06 ENCOUNTER — Inpatient Hospital Stay: Payer: Medicaid Other

## 2018-01-06 DIAGNOSIS — C220 Liver cell carcinoma: Secondary | ICD-10-CM

## 2018-01-06 MED ORDER — LIDOCAINE-PRILOCAINE 2.5-2.5 % EX CREA: TOPICAL_CREAM | CUTANEOUS | 3 refills | Status: DC

## 2018-01-07 ENCOUNTER — Telehealth: Payer: Self-pay | Admitting: *Deleted

## 2018-01-07 MED ORDER — LIDOCAINE-PRILOCAINE 2.5-2.5 % EX CREA: 1.0000 "application " | TOPICAL_CREAM | CUTANEOUS | 3 refills | Status: AC | PRN

## 2018-01-07 NOTE — Telephone Encounter (Signed)
Ok to go ahead

## 2018-01-07 NOTE — Telephone Encounter (Signed)
Verbal order called to Hospice for Phone call , she states they already have demo and notes form [facility]

## 2018-01-07 NOTE — Telephone Encounter (Signed)
Emla cream not received by pharmacy

## 2018-01-07 NOTE — Telephone Encounter (Signed)
Asking for orders/ referral for Palliative Care consult. Please advise

## 2018-01-10 NOTE — Progress Notes (Signed)
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  He was found to have portocaval adenopathy, large centrally obstructing liver mass as well as pulmonary metastases at the time of diagnosis.  No overt cirrhosis seen on imaging all the labs suggestive of chronic liver disease.  He was started on lenvatinib on 11/18/2017 and was tolerating it well.  Bilirubin was trending down from 6-2.5.  AFP had been stable to trending the down between 1300-1100.    Patient presented with worsening abdominal pain to the ER and underwent a repeat CT abdomen and pelvis with contrast which showed significant disease progression throughout the abdomen including the liver and the abdominal nodal stations.  Progressive pulmonary metastases and vascular extension of the tumor into the IVC portal vein and superior mesenteric veins.  Moderate intrahepatic biliary ductal dilatation due to mass-effect by central tumor.  Left lower lobe pulmonary emboli incompletely imaged.   Interval history- Patient feels quite fatigued. Abdominal pain not well controlled with oxycodone. Also reports constipation. Appetite is poor.  ECOG PS- 2 Pain scale- 6 Opioid associated constipation- yes  Review of systems- Review of Systems  Constitutional: Positive for malaise/fatigue and weight loss. Negative for chills and fever.       Lack of appetite  HENT: Negative for congestion,  ear discharge and nosebleeds.   Eyes: Negative for blurred vision.  Respiratory: Negative for cough, hemoptysis, sputum production, shortness of breath and wheezing.   Cardiovascular: Negative for chest pain, palpitations, orthopnea and claudication.  Gastrointestinal: Positive for abdominal pain and constipation. Negative for blood in stool, diarrhea, heartburn, melena, nausea and vomiting.  Genitourinary: Negative for dysuria, flank pain, frequency, hematuria and urgency.  Musculoskeletal: Negative for back pain, joint pain and myalgias.  Skin: Negative for rash.  Neurological: Positive for weakness. Negative for dizziness, tingling, focal weakness, seizures and headaches.  Endo/Heme/Allergies: Does not bruise/bleed easily.  Psychiatric/Behavioral: Negative for depression and suicidal ideas. The patient does not have insomnia.       No Known Allergies   Past Medical History:  Diagnosis Date  . Cancer ([place]) 12/16/2017   hepatocellular carcinoma  . Carpal tunnel syndrome   . Gallstones   . Gout   . Hepatitis C 11/09/2017  . Hypertension      Past Surgical History:  Procedure Laterality Date  . NO PAST SURGERIES    . PORTA CATH INSERTION N/A 01/04/2018   Procedure: PORTA CATH INSERTION;  Surgeon: Katha Cabal, MD;  Location: [place] [facility];  Service: Cardiovascular;  Laterality: N/A;    Social History   Socioeconomic History  . Marital status: Single    Spouse name: Not on file  . Number of children: Not on file  . Years of education: Not on file  . Highest education level: Not on file  Social Needs  . Financial resource strain: Not on file  . Food insecurity - worry: Not on file  . Food insecurity - inability: Not  on file  . Transportation needs - medical: Not on file  . Transportation needs - non-medical: Not on file  Occupational History  . Not on file  Tobacco Use  . Smoking status: Light Tobacco Smoker  . Smokeless tobacco: Never Used  Substance  and Sexual Activity  . Alcohol use: Yes    Comment: 2-24 oz beer 4X per week  . Drug use: No  . Sexual activity: Not on file  Other Topics Concern  . Not on file  Social History Narrative  . Not on file    Family History  Problem Relation Age of Onset  . Cancer Mother   . CVA Father      Current Outpatient Medications:  .  amLODipine (NORVASC) 5 MG tablet, Take 1 tablet (5 mg total) by mouth daily., Disp: 30 tablet, Rfl: 2 .  docusate sodium (COLACE) 100 MG capsule, Take 1 capsule (100 mg total) by mouth 2 (two) times daily., Disp: 10 capsule, Rfl: 0 .  enoxaparin (LOVENOX) 100 MG/ML injection, Inject 1 mL (100 mg total) into the skin daily., Disp: 30 Syringe, Rfl: 0 .  feeding supplement, ENSURE ENLIVE, (ENSURE ENLIVE) LIQD, Take 237 mLs by mouth 3 (three) times daily between meals., Disp: 90 Bottle, Rfl: 1 .  isosorbide mononitrate (IMDUR) 30 MG 24 hr tablet, Take 1 tablet (30 mg total) by mouth daily., Disp: 30 tablet, Rfl: 0 .  lidocaine-prilocaine (EMLA) cream, Apply 1 application topically as needed. Apply small amount to port site at least 1 hour prior to it being accessed, cover with plastic wrap, Disp: 30 g, Rfl: 3 .  megestrol (MEGACE) 40 MG tablet, Take 1 tablet (40 mg total) by mouth 2 (two) times daily., Disp: 60 tablet, Rfl: 0 .  ondansetron (ZOFRAN) 4 MG tablet, Take 1 tablet (4 mg total) by mouth every 6 (six) hours as needed for nausea or vomiting. (Patient not taking: Reported on 12/16/2017), Disp: 30 tablet, Rfl: 0 .  oxyCODONE (OXY IR/ROXICODONE) 5 MG immediate release tablet, Take 1 tablet (5 mg total) by mouth every 4 (four) hours as needed for moderate pain, severe pain or breakthrough pain., Disp: 60 tablet, Rfl: 0 .  senna-docusate (SENOKOT-S) 8.6-50 MG tablet, Take 1 tablet by mouth at bedtime as needed for mild constipation., Disp: , Rfl:   Physical exam:  Vitals:   01/11/18 0936  BP: 124/90  Pulse: (!) 128  Temp: (!) 96.6 F (35.9 C)  TempSrc: Tympanic   Weight: 150 lb (68 kg)   Physical Exam  Constitutional: He is oriented to person, place, and time.  Fatigued. Sitting in a wheelchair  HENT:  Head: Normocephalic and atraumatic.  Eyes: EOM are normal. Pupils are equal, round, and reactive to light.  Mild scleral icterus  Neck: Normal range of motion.  Cardiovascular: Regular rhythm and normal heart sounds.  tachycardic  Pulmonary/Chest: Effort normal and breath sounds normal.  Abdominal: Soft. Bowel sounds are normal.  Neurological: He is alert and oriented to person, place, and time.  Skin: Skin is warm and dry.     CMP Latest Ref Rng & Units 01/11/2018  Glucose 65 - 99 mg/dL 204(H)  BUN 6 - 20 mg/dL 27(H)  Creatinine 0.61 - 1.24 mg/dL 0.95  Sodium 135 - 145 mmol/L 132(L)  Potassium 3.5 - 5.1 mmol/L 4.0  Chloride 101 - 111 mmol/L 99(L)  CO2 22 - 32 mmol/L 26  Calcium 8.9 - 10.3 mg/dL 8.2(L)  Total Protein 6.5 - 8.1 g/dL 8.4(H)  Total  Bilirubin 0.3 - 1.2 mg/dL 2.4(H)  Alkaline Phos 38 - 126 U/L 144(H)  AST 15 - 41 U/L 74(H)  ALT 17 - 63 U/L 27   CBC Latest Ref Rng & Units 01/11/2018  WBC 3.8 - 10.6 K/uL 10.1  Hemoglobin 13.0 - 18.0 g/dL 16.7  Hematocrit 40.0 - 52.0 % 50.2  Platelets 150 - 440 K/uL 122(L)    No images are attached to the encounter.  Ct Abdomen Pelvis W Contrast  Result Date: 01/02/2018 CLINICAL DATA:  Right-sided abdominal pain for 3 months. Gastric/liver cancer diagnosed 3 months ago. Hepatitis-C. Diverticulitis suspected. EXAM: CT ABDOMEN AND PELVIS WITH CONTRAST TECHNIQUE: Multidetector CT imaging of the abdomen and pelvis was performed using the standard protocol following bolus administration of intravenous contrast. CONTRAST:  69mL ISOVUE-300 IOPAMIDOL (ISOVUE-300) INJECTION 61% COMPARISON:  11/08/2017 MRI.  11/07/2017 CT. FINDINGS: Lower chest: Right base atelectasis. New and enlarged pulmonary nodules, including an index 6 mm right lower lobe pulmonary nodule on image 1/series 4. Normal heart size  without pericardial or pleural effusion. Multivessel coronary artery atherosclerosis. Left-sided pulmonary emboli, including on image 3/series 2. Hepatobiliary: Central high right hepatic lobe mass measures 10.5 x 7.9 cm on image 18/series 2 versus 9.1 x 7.8 cm on the prior. Tumor extension into the IVC is new, including on image 16/series 2. Enlargement of the lateral segment left liver lobe lesion at 12 mm on image 22/series 2. Progression of moderate intrahepatic biliary duct dilatation, including on image 25/series 2. Gallstones without specific evidence of acute cholecystitis. No common duct dilatation. Pancreas: Normal, without mass or ductal dilatation. Spleen: Normal in size, without focal abnormality. Adrenals/Urinary Tract: Normal adrenal glands. Normal kidneys, without hydronephrosis. Normal urinary bladder. Stomach/Bowel: Normal stomach, without wall thickening. Normal colon, appendix, and terminal ileum. Underdistended proximal jejunum. Vascular/Lymphatic: Aortic and branch vessel atherosclerosis. Progressive portal vein involvement by liver tumor and porta hepatis adenopathy. Thrombus continuing into the superior mesenteric vein including on image 37/series 2. Secondary collaterals within the gastrohepatic ligament and surrounding the distal esophagus. Porta hepatis necrotic adenopathy at 5.4 x 6.7 cm on image 29/series 2 versus 4.3 x 3.7 cm on the prior. New retroperitoneal adenopathy, with a left periaortic node measuring 1.9 cm. No pelvic sidewall adenopathy. Reproductive: Moderate prostatomegaly. Other: New small volume abdominopelvic ascites. Musculoskeletal: Disc bulges at L4-5 and L5-S1. IMPRESSION: 1. Significant progression of disease throughout the abdomen, including within the liver and abdominal nodal stations. 2. Left lower lobe pulmonary emboli, incompletely imaged. 3. Progressive pulmonary metastasis. 4. Progressive vascular extension of tumor, including into the IVC, portal vein, and  superior mesenteric veins. 5. Progressive moderate intrahepatic biliary duct dilatation due to mass effect by central tumor and adenopathy. 6. Cholelithiasis. 7. New small volume abdominopelvic ascites. These results were called by telephone at the time of interpretation on 01/02/2018 at 10:52 am to Dr. Conni Slipper , who verbally acknowledged these results. Electronically Signed   By: Abigail Miyamoto M.D.   On: 01/02/2018 10:55   US Venous Img Lower Bilateral  Result Date: 01/02/2018 CLINICAL DATA:  Pulmonary embolism EXAM: BILATERAL LOWER EXTREMITY VENOUS DUPLEX ULTRASOUND TECHNIQUE: Doppler venous assessment of the bilateral lower extremity deep venous system was performed, including characterization of spectral flow, compressibility, and phasicity. COMPARISON:  None. FINDINGS: There is complete compressibility of the bilateral common femoral, femoral, and popliteal veins. Doppler analysis demonstrates respiratory phasicity and augmentation of flow with calf compression. No obvious superficial vein or calf vein thrombosis. IMPRESSION: No evidence of lower extremity DVT. Electronically  Signed   By: Marybelle Killings M.D.   On: 01/02/2018 14:23     Assessment and plan- Patient is a 62 y.o. male with metastatic [place] with lung and LN mets and progression on lenvatinib here for on treatment assessment prior to cycle 1 of opdivo  Counts are okay to proceed with cycle #1 of opdivo today.  Discussed risks and benefits of of Depo including all but not limited to autoimmune colitis causing diarrhea, autoimmune pneumonitis thyroiditis as well as abnormal liver and kidney functions.  Patient understands and agrees to proceed.  Treatment will be given with palliative intent. Discussed best supportive care/ hospice as a reasonable alternative due to aggressive malignancy and declining PS. Patient wants to try opdivo at this time  Patient does have active hepatitis C which has not been treated.  However opdivo has been used  in patients with active hepatitis C and has not been associated with acute worsening of hep C.  In fact some studies show improvement in viral loads with opdivo.  I will therefore continue opdivo and watch his liver functions closely.  Overall patient has a poor prognosis given rapid progression of disease on lenvatinib.  FDA recently approved cabozantinib for metastatic HCC as well and that could be considered as an option if he progresses on opdivo and his performance status is still acceptable for more treatment  I will see him back in 2 weeks time with CBC and CMP for cycle 2 of opdivo  Recent pulmonary embolism: Continue Lovenox 1.5 mg/kg once a day.  I will not switch him to Noac at this time given his underlying chronic liver disease  Neoplasm related pain- increase oxycodone to 10 mg Q4 prn for pain. Will also add fentanyl patch 12 mcg. Re assess pain in 1 week. I am hoping if pain is better controlled. Patient will be able to ambulate better and tolerate treatment better as well  Opioid induced constipation- miralax BID and take senna 2 tab at night. Re assess next week when seen at symptom management  I will give him 1 L fluids today due to his tachycardia and mild hyponatremia   Visit Diagnosis 1. Hepatocellular carcinoma ([place])   2. Goals of care, counseling/discussion   3. Encounter for antineoplastic immunotherapy   4. Other acute pulmonary embolism without acute cor pulmonale (HCC)      Dr. Randa Evens, MD, MPH [place] [facility] Pager- [phone] 01/10/2018

## 2018-01-11 ENCOUNTER — Inpatient Hospital Stay: Payer: Medicaid Other

## 2018-01-11 ENCOUNTER — Telehealth: Payer: Self-pay | Admitting: *Deleted

## 2018-01-11 ENCOUNTER — Inpatient Hospital Stay (HOSPITAL_BASED_OUTPATIENT_CLINIC_OR_DEPARTMENT_OTHER): Payer: Medicaid Other | Admitting: Oncology

## 2018-01-11 ENCOUNTER — Encounter: Payer: Self-pay | Admitting: Oncology

## 2018-01-11 VITALS — BP 124/90 | HR 128 | Temp 96.6°F | Wt 150.0 lb

## 2018-01-11 DIAGNOSIS — E871 Hypo-osmolality and hyponatremia: Secondary | ICD-10-CM | POA: Diagnosis not present

## 2018-01-11 DIAGNOSIS — C220 Liver cell carcinoma: Secondary | ICD-10-CM | POA: Diagnosis not present

## 2018-01-11 DIAGNOSIS — Z5112 Encounter for antineoplastic immunotherapy: Secondary | ICD-10-CM | POA: Diagnosis not present

## 2018-01-11 DIAGNOSIS — E860 Dehydration: Secondary | ICD-10-CM

## 2018-01-11 DIAGNOSIS — K5903 Drug induced constipation: Secondary | ICD-10-CM

## 2018-01-11 DIAGNOSIS — G893 Neoplasm related pain (acute) (chronic): Secondary | ICD-10-CM | POA: Diagnosis not present

## 2018-01-11 DIAGNOSIS — B1920 Unspecified viral hepatitis C without hepatic coma: Secondary | ICD-10-CM | POA: Diagnosis not present

## 2018-01-11 DIAGNOSIS — R000 Tachycardia, unspecified: Secondary | ICD-10-CM | POA: Diagnosis not present

## 2018-01-11 DIAGNOSIS — C7800 Secondary malignant neoplasm of unspecified lung: Secondary | ICD-10-CM

## 2018-01-11 DIAGNOSIS — Z7189 Other specified counseling: Secondary | ICD-10-CM

## 2018-01-11 DIAGNOSIS — C779 Secondary and unspecified malignant neoplasm of lymph node, unspecified: Secondary | ICD-10-CM

## 2018-01-11 DIAGNOSIS — Z86711 Personal history of pulmonary embolism: Secondary | ICD-10-CM

## 2018-01-11 DIAGNOSIS — I2699 Other pulmonary embolism without acute cor pulmonale: Secondary | ICD-10-CM

## 2018-01-11 LAB — COMPREHENSIVE METABOLIC PANEL
ALK PHOS: 144 U/L — AB (ref 38–126)
ALT: 27 U/L (ref 17–63)
ANION GAP: 7 (ref 5–15)
AST: 74 U/L — ABNORMAL HIGH (ref 15–41)
Albumin: 2.2 g/dL — ABNORMAL LOW (ref 3.5–5.0)
BUN: 27 mg/dL — ABNORMAL HIGH (ref 6–20)
CALCIUM: 8.2 mg/dL — AB (ref 8.9–10.3)
CO2: 26 mmol/L (ref 22–32)
Chloride: 99 mmol/L — ABNORMAL LOW (ref 101–111)
Creatinine, Ser: 0.95 mg/dL (ref 0.61–1.24)
Glucose, Bld: 204 mg/dL — ABNORMAL HIGH (ref 65–99)
Potassium: 4 mmol/L (ref 3.5–5.1)
Sodium: 132 mmol/L — ABNORMAL LOW (ref 135–145)
TOTAL PROTEIN: 8.4 g/dL — AB (ref 6.5–8.1)
Total Bilirubin: 2.4 mg/dL — ABNORMAL HIGH (ref 0.3–1.2)

## 2018-01-11 LAB — CBC WITH DIFFERENTIAL/PLATELET
BASOS ABS: 0 10*3/uL (ref 0–0.1)
BASOS PCT: 0 %
EOS ABS: 0 10*3/uL (ref 0–0.7)
Eosinophils Relative: 0 %
HCT: 50.2 % (ref 40.0–52.0)
HEMOGLOBIN: 16.7 g/dL (ref 13.0–18.0)
Lymphocytes Relative: 13 %
Lymphs Abs: 1.3 10*3/uL (ref 1.0–3.6)
MCH: 31.4 pg (ref 26.0–34.0)
MCHC: 33.2 g/dL (ref 32.0–36.0)
MCV: 94.5 fL (ref 80.0–100.0)
Monocytes Absolute: 1.2 10*3/uL — ABNORMAL HIGH (ref 0.2–1.0)
Monocytes Relative: 12 %
NEUTROS PCT: 75 %
Neutro Abs: 7.6 10*3/uL — ABNORMAL HIGH (ref 1.4–6.5)
Platelets: 122 10*3/uL — ABNORMAL LOW (ref 150–440)
RBC: 5.31 MIL/uL (ref 4.40–5.90)
RDW: 15.7 % — ABNORMAL HIGH (ref 11.5–14.5)
WBC: 10.1 10*3/uL (ref 3.8–10.6)

## 2018-01-11 MED ORDER — FENTANYL 12 MCG/HR TD PT72: 12.5000 ug | MEDICATED_PATCH | TRANSDERMAL | 0 refills | Status: DC

## 2018-01-11 MED ORDER — SODIUM CHLORIDE 0.9 % IV SOLN
Freq: Once | INTRAVENOUS | Status: AC
  Administered 2018-01-11: 11:00:00 via INTRAVENOUS
  Filled 2018-01-11: qty 1000

## 2018-01-11 MED ORDER — HEPARIN SOD (PORK) LOCK FLUSH 100 UNIT/ML IV SOLN
500.0000 [IU] | Freq: Once | INTRAVENOUS | Status: AC
  Administered 2018-01-11: 500 [IU] via INTRAVENOUS

## 2018-01-11 MED ORDER — SODIUM CHLORIDE 0.9% FLUSH
10.0000 mL | INTRAVENOUS | Status: DC | PRN
  Administered 2018-01-11: 10 mL via INTRAVENOUS
  Filled 2018-01-11: qty 10

## 2018-01-11 MED ORDER — OLANZAPINE 10 MG PO TABS: 10.0000 mg | ORAL_TABLET | Freq: Every day | ORAL | 0 refills | Status: AC

## 2018-01-11 MED ORDER — SODIUM CHLORIDE 0.9 % IV SOLN
240.0000 mg | Freq: Once | INTRAVENOUS | Status: AC
  Administered 2018-01-11: 240 mg via INTRAVENOUS
  Filled 2018-01-11: qty 24

## 2018-01-11 NOTE — Telephone Encounter (Signed)
Sent palliative care consult to fax [phone].

## 2018-01-12 LAB — AFP TUMOR MARKER: AFP, SERUM, TUMOR MARKER: 1159 ng/mL — AB (ref 0.0–8.3)

## 2018-01-13 ENCOUNTER — Inpatient Hospital Stay
Admission: EM | Admit: 2018-01-13 | Discharge: 2018-01-15 | DRG: 436 | Disposition: A | Discharge status: Hospice | Payer: Medicaid Other | Attending: Internal Medicine | Admitting: Internal Medicine

## 2018-01-13 ENCOUNTER — Other Ambulatory Visit: Payer: Self-pay

## 2018-01-13 ENCOUNTER — Ambulatory Visit: Payer: Self-pay | Admitting: Oncology

## 2018-01-13 ENCOUNTER — Emergency Department: Payer: Medicaid Other

## 2018-01-13 ENCOUNTER — Encounter: Payer: Self-pay | Admitting: Emergency Medicine

## 2018-01-13 DIAGNOSIS — C7800 Secondary malignant neoplasm of unspecified lung: Secondary | ICD-10-CM | POA: Diagnosis present

## 2018-01-13 DIAGNOSIS — M109 Gout, unspecified: Secondary | ICD-10-CM | POA: Diagnosis present

## 2018-01-13 DIAGNOSIS — F17200 Nicotine dependence, unspecified, uncomplicated: Secondary | ICD-10-CM | POA: Diagnosis present

## 2018-01-13 DIAGNOSIS — Z86711 Personal history of pulmonary embolism: Secondary | ICD-10-CM | POA: Diagnosis not present

## 2018-01-13 DIAGNOSIS — E860 Dehydration: Secondary | ICD-10-CM | POA: Diagnosis present

## 2018-01-13 DIAGNOSIS — B1920 Unspecified viral hepatitis C without hepatic coma: Secondary | ICD-10-CM | POA: Diagnosis present

## 2018-01-13 DIAGNOSIS — Z66 Do not resuscitate: Secondary | ICD-10-CM | POA: Diagnosis present

## 2018-01-13 DIAGNOSIS — Z515 Encounter for palliative care: Secondary | ICD-10-CM | POA: Diagnosis not present

## 2018-01-13 DIAGNOSIS — Z9221 Personal history of antineoplastic chemotherapy: Secondary | ICD-10-CM

## 2018-01-13 DIAGNOSIS — C220 Liver cell carcinoma: Principal | ICD-10-CM | POA: Diagnosis present

## 2018-01-13 DIAGNOSIS — K5903 Drug induced constipation: Secondary | ICD-10-CM | POA: Diagnosis present

## 2018-01-13 DIAGNOSIS — R4182 Altered mental status, unspecified: Secondary | ICD-10-CM | POA: Diagnosis present

## 2018-01-13 DIAGNOSIS — N179 Acute kidney failure, unspecified: Secondary | ICD-10-CM | POA: Diagnosis present

## 2018-01-13 DIAGNOSIS — R627 Adult failure to thrive: Secondary | ICD-10-CM | POA: Diagnosis present

## 2018-01-13 DIAGNOSIS — T402X5A Adverse effect of other opioids, initial encounter: Secondary | ICD-10-CM | POA: Diagnosis present

## 2018-01-13 DIAGNOSIS — I10 Essential (primary) hypertension: Secondary | ICD-10-CM | POA: Diagnosis present

## 2018-01-13 DIAGNOSIS — Z6821 Body mass index (BMI) 21.0-21.9, adult: Secondary | ICD-10-CM | POA: Diagnosis not present

## 2018-01-13 DIAGNOSIS — C779 Secondary and unspecified malignant neoplasm of lymph node, unspecified: Secondary | ICD-10-CM | POA: Diagnosis present

## 2018-01-13 DIAGNOSIS — R109 Unspecified abdominal pain: Secondary | ICD-10-CM | POA: Diagnosis not present

## 2018-01-13 DIAGNOSIS — Z79899 Other long term (current) drug therapy: Secondary | ICD-10-CM | POA: Diagnosis not present

## 2018-01-13 DIAGNOSIS — Z7901 Long term (current) use of anticoagulants: Secondary | ICD-10-CM

## 2018-01-13 LAB — TROPONIN I: Troponin I: 0.03 ng/mL (ref <0.03)

## 2018-01-13 LAB — URINALYSIS, COMPLETE (UACMP) WITH MICROSCOPIC: SPECIFIC GRAVITY, URINE: 1.022 (ref 1.005–1.030)

## 2018-01-13 LAB — BASIC METABOLIC PANEL
Anion gap: 7 (ref 5–15)
BUN: 42 mg/dL — AB (ref 6–20)
CALCIUM: 7.8 mg/dL — AB (ref 8.9–10.3)
CO2: 22 mmol/L (ref 22–32)
CREATININE: 1.66 mg/dL — AB (ref 0.61–1.24)
Chloride: 106 mmol/L (ref 101–111)
GFR calc Af Amer: 50 mL/min — ABNORMAL LOW (ref >60)
GFR, EST NON AFRICAN AMERICAN: 43 mL/min — AB (ref >60)
GLUCOSE: 135 mg/dL — AB (ref 65–99)
Potassium: 4.4 mmol/L (ref 3.5–5.1)
SODIUM: 135 mmol/L (ref 135–145)

## 2018-01-13 LAB — CBC
HEMATOCRIT: 52.9 % — AB (ref 40.0–52.0)
Hemoglobin: 17.2 g/dL (ref 13.0–18.0)
MCH: 31.4 pg (ref 26.0–34.0)
MCHC: 32.5 g/dL (ref 32.0–36.0)
MCV: 96.6 fL (ref 80.0–100.0)
Platelets: 129 10*3/uL — ABNORMAL LOW (ref 150–440)
RBC: 5.48 MIL/uL (ref 4.40–5.90)
RDW: 16.8 % — ABNORMAL HIGH (ref 11.5–14.5)
WBC: 12.5 10*3/uL — ABNORMAL HIGH (ref 3.8–10.6)

## 2018-01-13 LAB — COMPREHENSIVE METABOLIC PANEL
ALBUMIN: 2.4 g/dL — AB (ref 3.5–5.0)
ALT: 33 U/L (ref 17–63)
AST: 97 U/L — AB (ref 15–41)
Alkaline Phosphatase: 138 U/L — ABNORMAL HIGH (ref 38–126)
Anion gap: 12 (ref 5–15)
BILIRUBIN TOTAL: 2.9 mg/dL — AB (ref 0.3–1.2)
BUN: UNDETERMINED mg/dL (ref 6–20)
CHLORIDE: 104 mmol/L (ref 101–111)
CO2: 16 mmol/L — AB (ref 22–32)
Calcium: 8.3 mg/dL — ABNORMAL LOW (ref 8.9–10.3)
Creatinine, Ser: 1.85 mg/dL — ABNORMAL HIGH (ref 0.61–1.24)
GFR calc Af Amer: 44 mL/min — ABNORMAL LOW (ref >60)
GFR calc non Af Amer: 38 mL/min — ABNORMAL LOW (ref >60)
GLUCOSE: 159 mg/dL — AB (ref 65–99)
POTASSIUM: 5.7 mmol/L — AB (ref 3.5–5.1)
Sodium: 132 mmol/L — ABNORMAL LOW (ref 135–145)
Total Protein: 7.8 g/dL (ref 6.5–8.1)

## 2018-01-13 LAB — ETHANOL: Alcohol, Ethyl (B): <10 mg/dL (ref <10)

## 2018-01-13 LAB — GLUCOSE, CAPILLARY: Glucose-Capillary: 169 mg/dL — ABNORMAL HIGH (ref 65–99)

## 2018-01-13 LAB — PROTIME-INR
INR: 1.45
Prothrombin Time: 17.5 seconds — ABNORMAL HIGH (ref 11.4–15.2)

## 2018-01-13 LAB — AMMONIA: Ammonia: 27 umol/L (ref 9–35)

## 2018-01-13 MED ORDER — SODIUM CHLORIDE 0.9 % IV BOLUS (SEPSIS)
1000.0000 mL | Freq: Once | INTRAVENOUS | Status: AC
  Administered 2018-01-13: 1000 mL via INTRAVENOUS

## 2018-01-13 MED ORDER — ONDANSETRON HCL 4 MG/2ML IJ SOLN
4.0000 mg | Freq: Once | INTRAMUSCULAR | Status: DC
  Administered 2018-01-13: 4 mg via INTRAVENOUS
  Filled 2018-01-13: qty 2

## 2018-01-13 MED ORDER — HYDROCODONE-ACETAMINOPHEN 5-325 MG PO TABS
1.0000 | ORAL_TABLET | Freq: Once | ORAL | Status: AC
Start: 2018-01-13 — End: 2018-01-13
  Administered 2018-01-13: 1 via ORAL
  Filled 2018-01-13: qty 1

## 2018-01-13 MED ORDER — MORPHINE SULFATE (PF) 4 MG/ML IV SOLN
4.0000 mg | INTRAVENOUS | Status: DC | PRN
  Administered 2018-01-15: 11:00:00 4 mg via INTRAVENOUS
  Filled 2018-01-13: qty 1

## 2018-01-13 NOTE — ED Triage Notes (Signed)
Pt comes into the ED via POV c/o altered from baseline, abdominal pain, and leaning to the right.  Patient is a liver cancer patient and doing treatments for that.  Patient's family brought him here because he has had a decrease in motor activity.  Apparently the patient was sitting up straight, talking and able to answer questions.  Patient is unable to do that at this time and presents with trembles.  Patient last seen normal at 9:00 am and symptoms were discovered around 2:45 by family when they checked on him.  Patient has even and unlabored respirations at this time.  Patient not answering questions at this time.

## 2018-01-13 NOTE — H&P (Signed)
[place] NAME: Tim Potter    MR#:  [id number]  DATE OF BIRTH:  30-Jul-1956  DATE OF ADMISSION:  01/13/2018  PRIMARY CARE PHYSICIAN: Patient, No Pcp Per   REQUESTING/REFERRING PHYSICIAN: Quentin Cornwall, MD  CHIEF COMPLAINT:   Chief Complaint  Patient presents with  . Altered Mental Status    HISTORY OF PRESENT ILLNESS:  Tim Potter  is a 62 y.o. male who presents with an episode of confusion at home today.  Patient has metastatic hepatocellular carcinoma, and has started just this week on chemotherapy.  Family states that he has had very poor appetite.  On evaluation here in the ED he is more alert, but has some AK I.  Family and patient are expressing desire for conversation with oncologist and palliative care.  Hospitalist called for admission  PAST MEDICAL HISTORY:   Past Medical History:  Diagnosis Date  . Cancer ([place]) 12/16/2017   hepatocellular carcinoma  . Carpal tunnel syndrome   . Gallstones   . Gout   . Hepatitis C 11/09/2017  . Hypertension     PAST SURGICAL HISTORY:   Past Surgical History:  Procedure Laterality Date  . NO PAST SURGERIES    . PORTA CATH INSERTION N/A 01/04/2018   Procedure: PORTA CATH INSERTION;  Surgeon: Katha Cabal, MD;  Location: [place] [facility];  Service: Cardiovascular;  Laterality: N/A;    SOCIAL HISTORY:   Social History   Tobacco Use  . Smoking status: Light Tobacco Smoker  . Smokeless tobacco: Never Used  Substance Use Topics  . Alcohol use: Yes    Comment: 2-24 oz beer 4X per week    FAMILY HISTORY:   Family History  Problem Relation Age of Onset  . Cancer Mother   . CVA Father     DRUG ALLERGIES:  No Known Allergies  MEDICATIONS AT HOME:   Prior to Admission medications   Medication Sig Start Date End Date Taking? Authorizing Provider  amLODipine (NORVASC) 5 MG tablet Take 1 tablet (5 mg total) by mouth daily. 11/09/17   Gladstone Lighter, MD  docusate sodium (COLACE) 100 MG capsule Take 1 capsule (100 mg total) by mouth 2 (two) times daily. 01/05/18   Gouru, Illene Silver, MD  enoxaparin (LOVENOX) 100 MG/ML injection Inject 1 mL (100 mg total) into the skin daily. 01/05/18   Gouru, Illene Silver, MD  feeding supplement, ENSURE ENLIVE, (ENSURE ENLIVE) LIQD Take 237 mLs by mouth 3 (three) times daily between meals. 01/05/18   Gouru, Illene Silver, MD  fentaNYL (DURAGESIC - DOSED MCG/HR) 12 MCG/HR Place 1 patch (12.5 mcg total) onto the skin every 3 (three) days. 01/11/18   Sindy Guadeloupe, MD  isosorbide mononitrate (IMDUR) 30 MG 24 hr tablet Take 1 tablet (30 mg total) by mouth daily. 01/06/18   [name], MD  lidocaine-prilocaine (EMLA) cream Apply 1 application topically as needed. Apply small amount to port site at least 1 hour prior to it being accessed, cover with plastic wrap 01/07/18   Sindy Guadeloupe, MD  megestrol (MEGACE) 40 MG tablet Take 1 tablet (40 mg total) by mouth 2 (two) times daily. 01/05/18   Gouru, Illene Silver, MD  OLANZapine (ZYPREXA) 10 MG tablet Take 1 tablet (10 mg total) by mouth at bedtime. 01/11/18   Sindy Guadeloupe, MD  ondansetron (ZOFRAN) 4 MG tablet Take 1 tablet (4 mg total) by mouth every 6 (six) hours as needed for nausea or vomiting. 11/30/17  Sindy Guadeloupe, MD  oxyCODONE (OXY IR/ROXICODONE) 5 MG immediate release tablet Take 1 tablet (5 mg total) by mouth every 4 (four) hours as needed for moderate pain, severe pain or breakthrough pain. 12/10/17   Sindy Guadeloupe, MD  senna-docusate (SENOKOT-S) 8.6-50 MG tablet Take 1 tablet by mouth at bedtime as needed for mild constipation. 01/05/18   [name], MD    REVIEW OF SYSTEMS:  Review of Systems  Constitutional: Negative for chills, fever, malaise/fatigue and weight loss.  HENT: Negative for ear pain, hearing loss and tinnitus.   Eyes: Negative for blurred vision, double vision, pain and redness.  Respiratory: Negative for cough, hemoptysis and shortness of breath.    Cardiovascular: Negative for chest pain, palpitations, orthopnea and leg swelling.  Gastrointestinal: Negative for abdominal pain, constipation, diarrhea, nausea and vomiting.  Genitourinary: Positive for flank pain (Right upper flank and lower thorax -cancer related). Negative for dysuria, frequency and hematuria.  Musculoskeletal: Negative for back pain, joint pain and neck pain.  Skin:       No acne, rash, or lesions  Neurological: Negative for dizziness, tremors, focal weakness and weakness.  Endo/Heme/Allergies: Negative for polydipsia. Does not bruise/bleed easily.  Psychiatric/Behavioral: Negative for depression. The patient is not nervous/anxious and does not have insomnia.      VITAL SIGNS:   Vitals:   01/13/18 2200 01/13/18 2228 01/13/18 2230 01/13/18 2300  BP: 120/85 120/85 118/87 127/86  Pulse:  100    Resp: (!) 23 20 17  (!) 27  Temp:      TempSrc:      SpO2:  95%    Weight:      Height:       Wt Readings from Last 3 Encounters:  01/13/18 68 kg (150 lb)  01/11/18 68 kg (150 lb)  01/04/18 67.6 kg (149 lb)    PHYSICAL EXAMINATION:  Physical Exam  Vitals reviewed. Constitutional: He is oriented to person, place, and time. He appears well-developed and well-nourished. No distress.  HENT:  Head: Normocephalic and atraumatic.  Mouth/Throat: Oropharynx is clear and moist.  Eyes: Conjunctivae and EOM are normal. Pupils are equal, round, and reactive to light. No scleral icterus.  Neck: Normal range of motion. Neck supple. No JVD present. No thyromegaly present.  Cardiovascular: Normal rate, regular rhythm and intact distal pulses. Exam reveals no gallop and no friction rub.  No murmur heard. Respiratory: Effort normal and breath sounds normal. No respiratory distress. He has no wheezes. He has no rales.  GI: Soft. Bowel sounds are normal. He exhibits no distension. There is tenderness (Right upper quadrant).  Musculoskeletal: Normal range of motion. He exhibits no  edema.  No arthritis, no gout  Lymphadenopathy:    He has no cervical adenopathy.  Neurological: He is alert and oriented to person, place, and time. No cranial nerve deficit.  No dysarthria, no aphasia  Skin: Skin is warm and dry. No rash noted. No erythema.  Psychiatric: He has a normal mood and affect. His behavior is normal. Judgment and thought content normal.    LABORATORY PANEL:   CBC Recent Labs  Lab 01/13/18 1600  WBC 12.5*  HGB 17.2  HCT 52.9*  PLT 129*   ------------------------------------------------------------------------------------------------------------------  Chemistries  Recent Labs  Lab 01/13/18 1600 01/13/18 2221  NA 132* 135  K 5.7* 4.4  CL 104 106  CO2 16* 22  GLUCOSE 159* 135*  BUN QUANTITY NOT SUFFICIENT, UNABLE TO PERFORM TEST 42*  CREATININE 1.85* 1.66*  CALCIUM 8.3*  7.8*  AST 97*  --   ALT 33  --   ALKPHOS 138*  --   BILITOT 2.9*  --    ------------------------------------------------------------------------------------------------------------------  Cardiac Enzymes Recent Labs  Lab 01/13/18 1725  TROPONINI 0.03*   ------------------------------------------------------------------------------------------------------------------  RADIOLOGY:  Dg Chest 1 View  Result Date: 01/13/2018 CLINICAL DATA:  Altered mental status, history of hepatocellular carcinoma EXAM: CHEST 1 VIEW COMPARISON:  11/08/2017 FINDINGS: Cardiac shadow is stable. Right chest wall port is seen. Mild bibasilar atelectatic changes are seen. Scattered small nodular densities are noted within the mid right lung similar to some nodule seen on prior exam. No new focal abnormality is seen. IMPRESSION: Bibasilar changes as well as small pulmonary nodules. The overall appearance is stable from the prior CT. Electronically Signed   By: Inez Catalina M.D.   On: 01/13/2018 19:09   Ct Head Wo Contrast  Result Date: 01/13/2018 CLINICAL DATA:  Altered mental status and  decreased motor activity for several hours EXAM: CT HEAD WITHOUT CONTRAST TECHNIQUE: Contiguous axial images were obtained from the base of the skull through the vertex without intravenous contrast. COMPARISON:  03/16/2015 FINDINGS: Brain: No evidence of acute infarction, hemorrhage, hydrocephalus, extra-axial collection or mass lesion/mass effect. Vascular: No hyperdense vessel or unexpected calcification. Skull: Normal. Negative for fracture or focal lesion. Sinuses/Orbits: No acute finding. Other: Stable subcutaneous soft tissue lesion in the right posterior parietal region similar to that noted on prior exam. This likely represents a sebaceous cyst. IMPRESSION: No acute abnormality noted. Stable subcutaneous lesion on the right as described. Electronically Signed   By: Inez Catalina M.D.   On: 01/13/2018 17:32    EKG:   Orders placed or performed during the hospital encounter of 01/13/18  . ED EKG  . ED EKG    IMPRESSION AND PLAN:  Principal Problem:   AKI (acute kidney injury) ([place]) -renal function improved slightly with IV boluses in ED, but did not correct and normal.  Will administer IV fluids gently throughout the night tonight and avoid nephrotoxins. Active Problems:   Hepatocellular carcinoma [facility]) -oncology and palliative care consults for clarification of goals of care   HTN (hypertension) -continue home meds  All the records are reviewed and case discussed with ED provider. Management plans discussed with the patient and/or family.  DVT PROPHYLAXIS: Systemic anticoagulation  GI PROPHYLAXIS: None  ADMISSION STATUS: Inpatient  CODE STATUS: DNR Code Status History    Date Active Date Inactive Code Status Order ID Comments User Context   01/02/2018 12:15 01/05/2018 18:12 Full Code [id number]  Demetrios Loll, MD Inpatient   11/07/2017 17:13 11/09/2017 15:34 Full Code [id number]  Loletha Grayer, MD ED      TOTAL TIME TAKING CARE OF THIS PATIENT: 45 minutes.   Zeyna Mkrtchyan  Daphne 01/13/2018, 11:37 PM  [facility]  Office  [phone]  CC: Primary care physician; Patient, No Pcp Per  Note:  This document was prepared using Dragon voice recognition software and may include unintentional dictation errors.

## 2018-01-13 NOTE — ED Provider Notes (Signed)
[facility] Emergency Department Provider Note  ____________________________________________   First MD Initiated Contact with Patient 01/13/18 1712     (approximate)  I have reviewed the triage vital signs and the nursing notes.   HISTORY  Chief Complaint Altered Mental Status   HPI Tim Potter is a 62 y.o. male with a history of hepatocellular carcinoma on immunotherapy as well as hepatitis C and hypertension was presenting to the emergency department today with altered mentation.  He is here with his family who said that his mentation change at about 9 AM.  None of the family here with her to witness the acute change.  However, they say he is markedly more confused than he was yesterday.  Patient is able to state his name and where he is at this time but not the year or his birth date.  He is denying any pain.  Says that he is no longer drinking.  Family reports that he has had a slow decline in his cognitive abilities but this represents a much more abrupt change.  Noted also by family to be "leaning to the right" earlier today.  Of note, the patient was recently admitted to the hospital for pulmonary emboli and is now on home Lovenox.    Past Medical History:  Diagnosis Date  . Cancer ([place]) 12/16/2017   hepatocellular carcinoma  . Carpal tunnel syndrome   . Gallstones   . Gout   . Hepatitis C 11/09/2017  . Hypertension     Patient Active Problem List   Diagnosis Date Noted  . Pulmonary emboli ([place]) 01/02/2018  . Hepatocellular carcinoma ([place]) 12/16/2017  . Goals of care, counseling/discussion 11/16/2017  . Liver mass 11/07/2017    Past Surgical History:  Procedure Laterality Date  . NO PAST SURGERIES    . PORTA CATH INSERTION N/A 01/04/2018   Procedure: PORTA CATH INSERTION;  Surgeon: Katha Cabal, MD;  Location: [place] [facility];  Service: Cardiovascular;  Laterality: N/A;    Prior to Admission medications   Medication Sig  Start Date End Date Taking? Authorizing Provider  amLODipine (NORVASC) 5 MG tablet Take 1 tablet (5 mg total) by mouth daily. 11/09/17   Gladstone Lighter, MD  docusate sodium (COLACE) 100 MG capsule Take 1 capsule (100 mg total) by mouth 2 (two) times daily. 01/05/18   Gouru, Illene Silver, MD  enoxaparin (LOVENOX) 100 MG/ML injection Inject 1 mL (100 mg total) into the skin daily. 01/05/18   Gouru, Illene Silver, MD  feeding supplement, ENSURE ENLIVE, (ENSURE ENLIVE) LIQD Take 237 mLs by mouth 3 (three) times daily between meals. 01/05/18   Gouru, Illene Silver, MD  fentaNYL (DURAGESIC - DOSED MCG/HR) 12 MCG/HR Place 1 patch (12.5 mcg total) onto the skin every 3 (three) days. 01/11/18   Sindy Guadeloupe, MD  isosorbide mononitrate (IMDUR) 30 MG 24 hr tablet Take 1 tablet (30 mg total) by mouth daily. 01/06/18   [name], MD  lidocaine-prilocaine (EMLA) cream Apply 1 application topically as needed. Apply small amount to port site at least 1 hour prior to it being accessed, cover with plastic wrap 01/07/18   Sindy Guadeloupe, MD  megestrol (MEGACE) 40 MG tablet Take 1 tablet (40 mg total) by mouth 2 (two) times daily. 01/05/18   Gouru, Illene Silver, MD  OLANZapine (ZYPREXA) 10 MG tablet Take 1 tablet (10 mg total) by mouth at bedtime. 01/11/18   Sindy Guadeloupe, MD  ondansetron (ZOFRAN) 4 MG tablet Take 1 tablet (4  mg total) by mouth every 6 (six) hours as needed for nausea or vomiting. 11/30/17   Sindy Guadeloupe, MD  oxyCODONE (OXY IR/ROXICODONE) 5 MG immediate release tablet Take 1 tablet (5 mg total) by mouth every 4 (four) hours as needed for moderate pain, severe pain or breakthrough pain. 12/10/17   Sindy Guadeloupe, MD  senna-docusate (SENOKOT-S) 8.6-50 MG tablet Take 1 tablet by mouth at bedtime as needed for mild constipation. 01/05/18   [name], MD    Allergies Patient has no known allergies.  Family History  Problem Relation Age of Onset  . Cancer Mother   . CVA Father     Social History Social History   Tobacco  Use  . Smoking status: Light Tobacco Smoker  . Smokeless tobacco: Never Used  Substance Use Topics  . Alcohol use: Yes    Comment: 2-24 oz beer 4X per week  . Drug use: No    Review of Systems  Constitutional: No fever/chills Eyes: No visual changes. ENT: No sore throat. Cardiovascular: Denies chest pain. Respiratory: Denies shortness of breath. Gastrointestinal: No abdominal pain.  No nausea, no vomiting.  No diarrhea.  No constipation. Genitourinary: Negative for dysuria. Musculoskeletal: Negative for back pain. Skin: Negative for rash. Neurological: Negative for headaches, focal weakness or numbness.   ____________________________________________   PHYSICAL EXAM:  VITAL SIGNS: ED Triage Vitals  Enc Vitals Group     BP 01/13/18 1602 (!) 138/113     Pulse Rate 01/13/18 1602 (!) 117     Resp 01/13/18 1602 20     Temp 01/13/18 1602 97.7 F (36.5 C)     Temp Source 01/13/18 1602 Oral     SpO2 01/13/18 1602 95 %     Weight 01/13/18 1604 150 lb (68 kg)     Height 01/13/18 1604 5\' 10"  (1.778 m)     Head Circumference --      Peak Flow --      Pain Score --      Pain Loc --      Pain Edu? --      Excl. in [place]? --     Constitutional: Alert and oriented to self and place only.  in no acute distress. Eyes: Conjunctivae are normal.  Head: Atraumatic. Nose: No congestion/rhinnorhea. Mouth/Throat: Mucous membranes are moist.  Neck: No stridor.   Cardiovascular: Normal rate, regular rhythm. Grossly normal heart sounds.   Respiratory: Normal respiratory effort.  No retractions. Lungs CTAB. Gastrointestinal: Soft and nontender. No distention.  Musculoskeletal: No lower extremity tenderness nor edema.  No joint effusions. Neurologic:  Normal speech and language. No gross focal neurologic deficits are appreciated. Skin:  Skin is warm, dry and intact. No rash noted. Psychiatric: Mood and affect are normal. Speech and behavior are  normal.  ____________________________________________   LABS (all labs ordered are listed, but only abnormal results are displayed)  Labs Reviewed  COMPREHENSIVE METABOLIC PANEL - Abnormal; Notable for the following components:      Result Value   Sodium 132 (*)    Potassium 5.7 (*)    CO2 16 (*)    Glucose, Bld 159 (*)    Creatinine, Ser 1.85 (*)    Calcium 8.3 (*)    Albumin 2.4 (*)    AST 97 (*)    Alkaline Phosphatase 138 (*)    Total Bilirubin 2.9 (*)    GFR calc non Af Amer 38 (*)    GFR calc Af Amer 44 (*)  All other components within normal limits  CBC - Abnormal; Notable for the following components:   WBC 12.5 (*)    HCT 52.9 (*)    RDW 16.8 (*)    Platelets 129 (*)    All other components within normal limits  GLUCOSE, CAPILLARY - Abnormal; Notable for the following components:   Glucose-Capillary 169 (*)    All other components within normal limits  AMMONIA  ETHANOL  TROPONIN I  PROTIME-INR  CBG MONITORING, ED   ____________________________________________  EKG  ED ECG REPORT I, Doran Stabler, the attending physician, personally viewed and interpreted this ECG.   Date: 01/13/2018  EKG Time: 1837  Rate: 107  Rhythm: normal sinus rhythm  Axis: Normal  Intervals:none  ST&T Change: Minimal ST elevation in the inferior leads.  No reciprocal T wave inversions.  No ST depressions. Similar appearance to previous EKG of November 16, 2017. ____________________________________________  RADIOLOGY  No acute finding on head ct ____________________________________________   PROCEDURES  Procedure(s) performed:   Procedures  Critical Care performed:   ____________________________________________   INITIAL IMPRESSION / ASSESSMENT AND PLAN / ED COURSE  Pertinent labs & imaging results that were available during my care of the patient were reviewed by me and considered in my medical decision making (see chart for details).  Differential  diagnosis includes, but is not limited to, alcohol, illicit or prescription medications, or other toxic ingestion; intracranial pathology such as stroke or intracerebral hemorrhage; fever or infectious causes including sepsis; hypoxemia and/or hypercarbia; uremia; trauma; endocrine related disorders such as diabetes, hypoglycemia, and thyroid-related diseases; hypertensive encephalopathy; etc. As part of my medical decision making, I reviewed the following data within the [place] chart reviewed and Notes from prior ED visits  ----------------------------------------- 8:19 PM on 01/13/2018 -----------------------------------------  Patient is awake and alert at this time.  No complaints.  Family says that he is returning back to his baseline and that he does similar incidents this past week and a [place] where he was given multiple rounds of fluids and then had a return to his baseline mental status as well.  The family and the patient report now that he had his first round of immunotherapy this past Tuesday.  Heart rate in the room is 98.  Pending urinalysis at this time but if that is reassuring and the patient will be discharged home.  To follow-up with oncologist, Dr. Janese Banks.  Patient and family understanding and willing to comply.  Lab work is consistent with dehydration.       ____________________________________________   FINAL CLINICAL IMPRESSION(S) / ED DIAGNOSES  Altered mental status.  Dehydration.    NEW MEDICATIONS STARTED DURING THIS VISIT:  New Prescriptions   No medications on file     Note:  This document was prepared using Dragon voice recognition software and may include unintentional dictation errors.     Orbie Pyo, MD 01/13/18 2020

## 2018-01-13 NOTE — ED Provider Notes (Addendum)
Patient received in sign-out from Dr. Clearnce Hasten.  Workup and evaluation pending assessment of urinalysis.  Went to evaluate patient and speak with family regarding urinalysis.  Urinalysis actually appears at baseline.  Family is concerned that they keep having to go back and forth from the hospital to home because the patient's not eating like he supposed to be at home and they cannot figure out a way to encourage more p.o. intake.  After further discussion we will give additional IV fluids and recheck BMP.  At this point I do not see any indication for admission the hospital as his symptoms have resolved.  Patient will be signed out to Dr. Owens Shark.  Plan discharge home pending IV fluid bolus and repeat BMP  ----------------------------------------- 11:31 PM on 01/13/2018 -----------------------------------------  Repeat BMP with persistent AK I and elevated BUN.  Potassium has improved.  Had extensive conversation with family at bedside does seem that he is having difficulty controlling his pain and symptomatic management at home and he has had significant deterioration.  Family requesting discussion evaluation by palliative care and discussion about hospice.  If patient seems to be hesitant to say that he wants hospice but states that he does want to focus on his symptoms and is not sure that he wants to continue with chemotherapy.  Based on these findings with his AK I persistent pain.  I spoke with hospitalist, Dr. Jannifer Franklin for admission for IV fluids and evaluation by hospice and further medical evaluation.       Merlyn Lot, MD 01/13/18 [phone]

## 2018-01-13 NOTE — ED Notes (Signed)
Attempted to give report; was told nurse would call me back.

## 2018-01-14 DIAGNOSIS — R4182 Altered mental status, unspecified: Secondary | ICD-10-CM

## 2018-01-14 DIAGNOSIS — K5903 Drug induced constipation: Secondary | ICD-10-CM

## 2018-01-14 DIAGNOSIS — R109 Unspecified abdominal pain: Secondary | ICD-10-CM

## 2018-01-14 DIAGNOSIS — E860 Dehydration: Secondary | ICD-10-CM

## 2018-01-14 DIAGNOSIS — C779 Secondary and unspecified malignant neoplasm of lymph node, unspecified: Secondary | ICD-10-CM

## 2018-01-14 DIAGNOSIS — Z515 Encounter for palliative care: Secondary | ICD-10-CM

## 2018-01-14 DIAGNOSIS — C220 Liver cell carcinoma: Principal | ICD-10-CM

## 2018-01-14 DIAGNOSIS — C7800 Secondary malignant neoplasm of unspecified lung: Secondary | ICD-10-CM

## 2018-01-14 DIAGNOSIS — R627 Adult failure to thrive: Secondary | ICD-10-CM

## 2018-01-14 DIAGNOSIS — N179 Acute kidney failure, unspecified: Secondary | ICD-10-CM

## 2018-01-14 LAB — BASIC METABOLIC PANEL
ANION GAP: 7 (ref 5–15)
BUN: 37 mg/dL — ABNORMAL HIGH (ref 6–20)
CHLORIDE: 107 mmol/L (ref 101–111)
CO2: 23 mmol/L (ref 22–32)
Calcium: 8 mg/dL — ABNORMAL LOW (ref 8.9–10.3)
Creatinine, Ser: 1.42 mg/dL — ABNORMAL HIGH (ref 0.61–1.24)
GFR calc non Af Amer: 52 mL/min — ABNORMAL LOW (ref >60)
GLUCOSE: 117 mg/dL — AB (ref 65–99)
POTASSIUM: 4.3 mmol/L (ref 3.5–5.1)
Sodium: 137 mmol/L (ref 135–145)

## 2018-01-14 MED ORDER — ISOSORBIDE MONONITRATE ER 30 MG PO TB24
30.0000 mg | ORAL_TABLET | Freq: Every day | ORAL | Status: DC
  Administered 2018-01-14 – 2018-01-15 (×2): 30 mg via ORAL
  Filled 2018-01-14 (×2): qty 1

## 2018-01-14 MED ORDER — FENTANYL 25 MCG/HR TD PT72
25.0000 ug | MEDICATED_PATCH | TRANSDERMAL | Status: DC
  Administered 2018-01-14: 15:00:00 25 ug via TRANSDERMAL
  Filled 2018-01-14: qty 1

## 2018-01-14 MED ORDER — SENNA 8.6 MG PO TABS
1.0000 | ORAL_TABLET | Freq: Two times a day (BID) | ORAL | Status: DC
  Administered 2018-01-14 – 2018-01-15 (×2): 8.6 mg via ORAL
  Filled 2018-01-14 (×2): qty 1

## 2018-01-14 MED ORDER — ACETAMINOPHEN 650 MG RE SUPP: 650.0000 mg | Freq: Four times a day (QID) | RECTAL | Status: DC | PRN

## 2018-01-14 MED ORDER — OXYCODONE HCL 5 MG PO TABS
10.0000 mg | ORAL_TABLET | ORAL | Status: DC | PRN
  Administered 2018-01-15: 08:00:00 10 mg via ORAL
  Filled 2018-01-14: qty 2

## 2018-01-14 MED ORDER — OXYCODONE HCL 5 MG PO TABS
5.0000 mg | ORAL_TABLET | ORAL | Status: DC | PRN
  Administered 2018-01-14: 5 mg via ORAL
  Filled 2018-01-14: qty 1

## 2018-01-14 MED ORDER — FENTANYL 12 MCG/HR TD PT72
12.5000 ug | MEDICATED_PATCH | TRANSDERMAL | Status: DC
  Administered 2018-01-14: 12.5 ug via TRANSDERMAL
  Filled 2018-01-14: qty 1

## 2018-01-14 MED ORDER — OLANZAPINE 10 MG PO TABS
10.0000 mg | ORAL_TABLET | Freq: Every day | ORAL | Status: DC
  Administered 2018-01-14: 22:00:00 10 mg via ORAL
  Filled 2018-01-14 (×2): qty 1

## 2018-01-14 MED ORDER — ONDANSETRON HCL 4 MG/2ML IJ SOLN: 4.0000 mg | Freq: Four times a day (QID) | INTRAMUSCULAR | Status: DC | PRN

## 2018-01-14 MED ORDER — ACETAMINOPHEN 325 MG PO TABS: 650.0000 mg | ORAL_TABLET | Freq: Four times a day (QID) | ORAL | Status: DC | PRN

## 2018-01-14 MED ORDER — SODIUM CHLORIDE 0.9 % IV SOLN
INTRAVENOUS | Status: AC
  Administered 2018-01-14: 02:00:00 via INTRAVENOUS

## 2018-01-14 MED ORDER — AMLODIPINE BESYLATE 5 MG PO TABS
5.0000 mg | ORAL_TABLET | Freq: Every day | ORAL | Status: DC
  Administered 2018-01-14 – 2018-01-15 (×2): 5 mg via ORAL
  Filled 2018-01-14 (×2): qty 1

## 2018-01-14 MED ORDER — ENOXAPARIN SODIUM 100 MG/ML ~~LOC~~ SOLN
1.5000 mg/kg | SUBCUTANEOUS | Status: DC
  Administered 2018-01-14 – 2018-01-15 (×2): 100 mg via SUBCUTANEOUS
  Filled 2018-01-14 (×2): qty 1

## 2018-01-14 NOTE — Consult Note (Signed)
Hematology/Oncology Consult note [facility] [phone] [phone]  Patient Care Team: Patient, No Pcp Per as PCP - General (General Practice)   Name of the patient: Tim Potter  [id number]  [birth year]    Reason for consult: metastatic [facility]   Requesting physician: Dr. 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  He was started on lenvatinib in November 2018 but had a quick progression within 1-1/2 months.  He was recently admitted to the hospital about 2-3 weeks ago with worsening abdominal pain and scan showed progression of disease in his liver as well as lungs.  There was evidence of tumor thrombus all the way up to his IVC as well as new pulmonary emboli.  I met with the patient and his son and discussed that overall his prognosis is poor and second line treatment with opdivo could be attempted.  Patient and his family desire treatment hoping that he would be able to tolerate treatment if his pain is better controlled.  I increased his oxycodone to 10 mg every 4 hours as needed for pain along with fentanyl patch 12 mcg.  He received his first dose of opdivo on 01/11/2018.  He was admitted to the hospital yesterday for concerns of dehydration  Patient states his abdominal pain is still not well contrlled. He had a BM yesterday. His appetite is poor and overall he has been feeling very weak. He wishes to rmeain comfortable and go home.   ECOG PS- 3  Pain scale- 5   Review of systems- Review of Systems  Constitutional: Positive for malaise/fatigue and weight loss. Negative for chills and fever.  HENT: Negative for congestion, ear discharge and nosebleeds.   Eyes: Negative for blurred vision.  Respiratory: Negative for cough,  hemoptysis, sputum production, shortness of breath and wheezing.   Cardiovascular: Negative for chest pain, palpitations, orthopnea and claudication.  Gastrointestinal: Positive for abdominal pain. Negative for blood in stool, constipation, diarrhea, heartburn, melena, nausea and vomiting.  Genitourinary: Negative for dysuria, flank pain, frequency, hematuria and urgency.  Musculoskeletal: Negative for back pain, joint pain and myalgias.  Skin: Negative for rash.  Neurological: Positive for weakness. Negative for dizziness, tingling, focal weakness, seizures and headaches.  Endo/Heme/Allergies: Does not bruise/bleed easily.  Psychiatric/Behavioral: Negative for depression and suicidal ideas. The patient does not have insomnia.     No Known Allergies  Patient Active Problem List   Diagnosis Date Noted  . AKI (acute kidney injury) ([place]) 01/13/2018  . HTN (hypertension) 01/13/2018  . Cancer, hepatocellular ([place]) 01/13/2018  . Pulmonary emboli ([place]) 01/02/2018  . Hepatocellular carcinoma ([place]) 12/16/2017  . Goals of care, counseling/discussion 11/16/2017  . Liver mass 11/07/2017     Past Medical History:  Diagnosis Date  . Cancer ([place]) 12/16/2017   hepatocellular carcinoma  . Carpal tunnel syndrome   . Gallstones   . Gout   . Hepatitis C 11/09/2017  . Hypertension      Past Surgical History:  Procedure Laterality Date  . NO PAST SURGERIES    . PORTA CATH INSERTION N/A 01/04/2018   Procedure: PORTA CATH INSERTION;  Surgeon: Katha Cabal, MD;  Location: [place] [facility];  Service: Cardiovascular;  Laterality: N/A;    Social History   Socioeconomic History  . Marital status: Single  Spouse name: Not on file  . Number of children: Not on file  . Years of education: Not on file  . Highest education level: Not on file  Social Needs  . Financial resource strain: Not on file  . Food insecurity - worry: Not on file  . Food insecurity - inability: Not on file  .  Transportation needs - medical: Not on file  . Transportation needs - non-medical: Not on file  Occupational History  . Not on file  Tobacco Use  . Smoking status: Light Tobacco Smoker  . Smokeless tobacco: Never Used  Substance and Sexual Activity  . Alcohol use: Yes    Comment: 2-24 oz beer 4X per week  . Drug use: No  . Sexual activity: Not on file  Other Topics Concern  . Not on file  Social History Narrative  . Not on file     Family History  Problem Relation Age of Onset  . Cancer Mother   . CVA Father      Current Facility-Administered Medications:  .  acetaminophen (TYLENOL) tablet 650 mg, 650 mg, Oral, Q6H PRN **OR** acetaminophen (TYLENOL) suppository 650 mg, 650 mg, Rectal, Q6H PRN, Lance Coon, MD .  amLODipine (NORVASC) tablet 5 mg, 5 mg, Oral, Daily, Lance Coon, MD, 5 mg at 01/14/18 1051 .  enoxaparin (LOVENOX) injection 100 mg, 1.5 mg/kg, Subcutaneous, Q24H, Lance Coon, MD, 100 mg at 01/14/18 1051 .  fentaNYL (DURAGESIC - dosed mcg/hr) 12.5 mcg, 12.5 mcg, Transdermal, Q72H, Lance Coon, MD, 12.5 mcg at 01/14/18 0158 .  isosorbide mononitrate (IMDUR) 24 hr tablet 30 mg, 30 mg, Oral, Daily, Lance Coon, MD, 30 mg at 01/14/18 1051 .  morphine 4 MG/ML injection 4 mg, 4 mg, Intravenous, Q3H PRN, Merlyn Lot, MD .  OLANZapine (ZYPREXA) tablet 10 mg, 10 mg, Oral, QHS, Lance Coon, MD .  ondansetron [facility]) injection 4 mg, 4 mg, Intravenous, Q6H PRN, Lance Coon, MD .  oxyCODONE (Oxy IR/ROXICODONE) immediate release tablet 5 mg, 5 mg, Oral, Q4H PRN, Lance Coon, MD, 5 mg at 01/14/18 1055   Physical exam:  Vitals:   01/14/18 0000 01/14/18 0104 01/14/18 0430 01/14/18 1047  BP: 138/83 121/80 123/80 116/79  Pulse: (!) 106 (!) 102 92 97  Resp: 16 18 18 16   Temp:  98.1 F (36.7 C) 98.3 F (36.8 C) 98.7 F (37.1 C)  TempSrc:  Oral Oral Oral  SpO2: 95% 94% 97% 96%  Weight:      Height:       Physical Exam  Constitutional: He is oriented to  person, place, and time.  Fatigued appears in no acute distress  HENT:  Head: Normocephalic and atraumatic.  Eyes: EOM are normal. Pupils are equal, round, and reactive to light.  Mild scleral icterus  Neck: Normal range of motion.  Cardiovascular: Normal rate, regular rhythm and normal heart sounds.  Pulmonary/Chest: Effort normal and breath sounds normal.  Abdominal: Soft. Bowel sounds are normal.  TTP in RUQ  Neurological: He is alert and oriented to person, place, and time.  Skin: Skin is warm and dry.       CMP Latest Ref Rng & Units 01/14/2018  Glucose 65 - 99 mg/dL 117(H)  BUN 6 - 20 mg/dL 37(H)  Creatinine 0.61 - 1.24 mg/dL 1.42(H)  Sodium 135 - 145 mmol/L 137  Potassium 3.5 - 5.1 mmol/L 4.3  Chloride 101 - 111 mmol/L 107  CO2 22 - 32 mmol/L 23  Calcium 8.9 - 10.3 mg/dL 8.0(L)  Total Protein 6.5 - 8.1 g/dL -  Total Bilirubin 0.3 - 1.2 mg/dL -  Alkaline Phos 38 - 126 U/L -  AST 15 - 41 U/L -  ALT 17 - 63 U/L -   CBC Latest Ref Rng & Units 01/13/2018  WBC 3.8 - 10.6 K/uL 12.5(H)  Hemoglobin 13.0 - 18.0 g/dL 17.2  Hematocrit 40.0 - 52.0 % 52.9(H)  Platelets 150 - 440 K/uL 129(L)    @IMAGES @  Dg Chest 1 View  Result Date: 01/13/2018 CLINICAL DATA:  Altered mental status, history of hepatocellular carcinoma EXAM: CHEST 1 VIEW COMPARISON:  11/08/2017 FINDINGS: Cardiac shadow is stable. Right chest wall port is seen. Mild bibasilar atelectatic changes are seen. Scattered small nodular densities are noted within the mid right lung similar to some nodule seen on prior exam. No new focal abnormality is seen. IMPRESSION: Bibasilar changes as well as small pulmonary nodules. The overall appearance is stable from the prior CT. Electronically Signed   By: Inez Catalina M.D.   On: 01/13/2018 19:09   Ct Head Wo Contrast  Result Date: 01/13/2018 CLINICAL DATA:  Altered mental status and decreased motor activity for several hours EXAM: CT HEAD WITHOUT CONTRAST TECHNIQUE: Contiguous  axial images were obtained from the base of the skull through the vertex without intravenous contrast. COMPARISON:  03/16/2015 FINDINGS: Brain: No evidence of acute infarction, hemorrhage, hydrocephalus, extra-axial collection or mass lesion/mass effect. Vascular: No hyperdense vessel or unexpected calcification. Skull: Normal. Negative for fracture or focal lesion. Sinuses/Orbits: No acute finding. Other: Stable subcutaneous soft tissue lesion in the right posterior parietal region similar to that noted on prior exam. This likely represents a sebaceous cyst. IMPRESSION: No acute abnormality noted. Stable subcutaneous lesion on the right as described. Electronically Signed   By: Inez Catalina M.D.   On: 01/13/2018 17:32   Ct Abdomen Pelvis W Contrast  Result Date: 01/02/2018 CLINICAL DATA:  Right-sided abdominal pain for 3 months. Gastric/liver cancer diagnosed 3 months ago. Hepatitis-C. Diverticulitis suspected. EXAM: CT ABDOMEN AND PELVIS WITH CONTRAST TECHNIQUE: Multidetector CT imaging of the abdomen and pelvis was performed using the standard protocol following bolus administration of intravenous contrast. CONTRAST:  30m ISOVUE-300 IOPAMIDOL (ISOVUE-300) INJECTION 61% COMPARISON:  11/08/2017 MRI.  11/07/2017 CT. FINDINGS: Lower chest: Right base atelectasis. New and enlarged pulmonary nodules, including an index 6 mm right lower lobe pulmonary nodule on image 1/series 4. Normal heart size without pericardial or pleural effusion. Multivessel coronary artery atherosclerosis. Left-sided pulmonary emboli, including on image 3/series 2. Hepatobiliary: Central high right hepatic lobe mass measures 10.5 x 7.9 cm on image 18/series 2 versus 9.1 x 7.8 cm on the prior. Tumor extension into the IVC is new, including on image 16/series 2. Enlargement of the lateral segment left liver lobe lesion at 12 mm on image 22/series 2. Progression of moderate intrahepatic biliary duct dilatation, including on image 25/series 2.  Gallstones without specific evidence of acute cholecystitis. No common duct dilatation. Pancreas: Normal, without mass or ductal dilatation. Spleen: Normal in size, without focal abnormality. Adrenals/Urinary Tract: Normal adrenal glands. Normal kidneys, without hydronephrosis. Normal urinary bladder. Stomach/Bowel: Normal stomach, without wall thickening. Normal colon, appendix, and terminal ileum. Underdistended proximal jejunum. Vascular/Lymphatic: Aortic and branch vessel atherosclerosis. Progressive portal vein involvement by liver tumor and porta hepatis adenopathy. Thrombus continuing into the superior mesenteric vein including on image 37/series 2. Secondary collaterals within the gastrohepatic ligament and surrounding the distal esophagus. Porta hepatis necrotic adenopathy at 5.4 x 6.7 cm on image 29/series  2 versus 4.3 x 3.7 cm on the prior. New retroperitoneal adenopathy, with a left periaortic node measuring 1.9 cm. No pelvic sidewall adenopathy. Reproductive: Moderate prostatomegaly. Other: New small volume abdominopelvic ascites. Musculoskeletal: Disc bulges at L4-5 and L5-S1. IMPRESSION: 1. Significant progression of disease throughout the abdomen, including within the liver and abdominal nodal stations. 2. Left lower lobe pulmonary emboli, incompletely imaged. 3. Progressive pulmonary metastasis. 4. Progressive vascular extension of tumor, including into the IVC, portal vein, and superior mesenteric veins. 5. Progressive moderate intrahepatic biliary duct dilatation due to mass effect by central tumor and adenopathy. 6. Cholelithiasis. 7. New small volume abdominopelvic ascites. These results were called by telephone at the time of interpretation on 01/02/2018 at 10:52 am to Dr. Conni Slipper , who verbally acknowledged these results. Electronically Signed   By: Abigail Miyamoto M.D.   On: 01/02/2018 10:55   US Venous Img Lower Bilateral  Result Date: 01/02/2018 CLINICAL DATA:  Pulmonary embolism EXAM:  BILATERAL LOWER EXTREMITY VENOUS DUPLEX ULTRASOUND TECHNIQUE: Doppler venous assessment of the bilateral lower extremity deep venous system was performed, including characterization of spectral flow, compressibility, and phasicity. COMPARISON:  None. FINDINGS: There is complete compressibility of the bilateral common femoral, femoral, and popliteal veins. Doppler analysis demonstrates respiratory phasicity and augmentation of flow with calf compression. No obvious superficial vein or calf vein thrombosis. IMPRESSION: No evidence of lower extremity DVT. Electronically Signed   By: Marybelle Killings M.D.   On: 01/02/2018 14:23    Assessment and plan- Patient is a 62 y.o. male with metastatic [place] with lung and lymph node metastases as well as recent pulmonary emboli admitted for failure to thrive  Abdominal pain- fentanyl patch was added on 3 days ago and oxycodone dose was increased. Pain still not well controlled. Recommend increasing fentanyl dosing  Continue bowel meds for opioid induced constipation  Continue lovenox for PE  HCC: overall prognosis is very poor. His performance status is declining. opdivo may control his disease but will only add months not years. Patient understands his prognosis is poor and he wishes to rmeian comfortable and go home. I have discussed all this with patients sister as well. It would be reasonable to discuss hospice options at this time. He will f/u in my clinic next week as scheduled to continue goals of care discussion. He is a DNR   AKI: improving with IVF    Visit Diagnosis 1. Dehydration   2. Altered mental status, unspecified altered mental status type   3. AKI (acute kidney injury) ([place])     Dr. Randa Evens, MD, MPH [place] [facility] Pager- [phone] 01/14/2018

## 2018-01-14 NOTE — Care Management (Signed)
Spoke with Mr. Fiumara sister, Everlene Farrier. Discussed Hospice agencies. Chose [facility] Discharge to home 01/15/18 with Hospice services in place per Dr. Gus Height. Reginia Forts RN MSN CCM Care Management [phone]

## 2018-01-14 NOTE — Progress Notes (Signed)
Please note patient has a pending out patient PALLIATIVE referral. CMRN Jeannie Fend made aware. [name] RN, BSN, [facility] [place], hospital Liaison [phone]

## 2018-01-14 NOTE — Progress Notes (Signed)
New referral for [facility] services received from [facility] following a Palliative Medicine consult. Patient has a known history of hepatocellular cancer admitted to [facility] on for treatment of acute kidney injury. Per chart note review he has had a very poor appetite, in the ED family wanted to speak with patient's oncologist and Palliative care. Both Dr. Janese Banks and Palliative Medicine NP Ihor Dow met with patient and his sister today, they have chosen to forego any further treatment and wish to return home with hospice services. Writer met with patient's sister Everlene Farrier outside of the patient's room, she did not want to discuss hospice in front of Mr. Santellan. Everlene Farrier is very familiar with hospice services as she has had family member receive hospice in the past. She has requested a  Hospital bed, but does not want delivery until Monday 1/28 as she will be working this weekend. She stated that her brother does not need the bed prior to discharge, she is aware that discharge is planned for tomorrow. Hospice information and contact number given to [facility]. Patient information and bed request faxed to referral. Hospital care team updated. [name] RN, BSN, [place] [facility] [place], hospital Liaison [phone]

## 2018-01-14 NOTE — Progress Notes (Signed)
[place] [facility] [place] NAME: Tim Potter    MR#:  [id number]  DATE OF BIRTH:  02/07/1956  SUBJECTIVE:   Complains of abdominal pain.  Ate little bit of breakfast this morning.  Sister in the room.  No vomiting REVIEW OF SYSTEMS:   Review of Systems  Constitutional: Positive for malaise/fatigue and weight loss. Negative for chills and fever.  HENT: Negative for ear discharge, ear pain and nosebleeds.   Eyes: Negative for blurred vision, pain and discharge.  Respiratory: Negative for sputum production, shortness of breath, wheezing and stridor.   Cardiovascular: Negative for chest pain, palpitations, orthopnea and PND.  Gastrointestinal: Negative for diarrhea, nausea and vomiting.  Genitourinary: Negative for frequency and urgency.  Musculoskeletal: Negative for back pain and joint pain.  Neurological: Positive for weakness. Negative for sensory change, speech change and focal weakness.  Psychiatric/Behavioral: Negative for depression and hallucinations. The patient is not nervous/anxious.    Tolerating Diet:yes Tolerating PT: Ambulatory  DRUG ALLERGIES:  No Known Allergies  VITALS:  Blood pressure 116/79, pulse 97, temperature 98.7 F (37.1 C), temperature source Oral, resp. rate 16, height 5\' 10"  (1.778 m), weight 68 kg (150 lb), SpO2 96 %.  PHYSICAL EXAMINATION:   Physical Exam  GENERAL:  62 y.o.-year-old patient lying in the bed with no acute distress.  Appears chronically ill malnourished EYES: Pupils equal, round, reactive to light and accommodation. No scleral icterus. Extraocular muscles intact.  HEENT: Head atraumatic, normocephalic. Oropharynx and nasopharynx clear.  NECK:  Supple, no jugular venous distention. No thyroid enlargement, no tenderness.  LUNGS: Normal breath sounds bilaterally, no wheezing, rales, rhonchi. No use of accessory muscles of respiration.  CARDIOVASCULAR: S1, S2 normal. No murmurs, rubs, or  gallops.  ABDOMEN: Soft, nontender, nondistended. Bowel sounds present. No organomegaly or mass.  EXTREMITIES: No cyanosis, clubbing or edema b/l.    NEUROLOGIC: Cranial nerves II through XII are intact. No focal Motor or sensory deficits b/l.   PSYCHIATRIC:  patient is alert and oriented x 3.  SKIN: No obvious rash, lesion, or ulcer.   LABORATORY PANEL:  CBC Recent Labs  Lab 01/13/18 1600  WBC 12.5*  HGB 17.2  HCT 52.9*  PLT 129*    Chemistries  Recent Labs  Lab 01/13/18 1600  01/14/18 0801  NA 132*   < > 137  K 5.7*   < > 4.3  CL 104   < > 107  CO2 16*   < > 23  GLUCOSE 159*   < > 117*  BUN QUANTITY NOT SUFFICIENT, UNABLE TO PERFORM TEST   < > 37*  CREATININE 1.85*   < > 1.42*  CALCIUM 8.3*   < > 8.0*  AST 97*  --   --   ALT 33  --   --   ALKPHOS 138*  --   --   BILITOT 2.9*  --   --    < > = values in this interval not displayed.   Cardiac Enzymes Recent Labs  Lab 01/13/18 1725  TROPONINI 0.03*   RADIOLOGY:  Dg Chest 1 View  Result Date: 01/13/2018 CLINICAL DATA:  Altered mental status, history of hepatocellular carcinoma EXAM: CHEST 1 VIEW COMPARISON:  11/08/2017 FINDINGS: Cardiac shadow is stable. Right chest wall port is seen. Mild bibasilar atelectatic changes are seen. Scattered small nodular densities are noted within the mid right lung similar to some nodule seen on prior exam. No new focal abnormality is seen. IMPRESSION:  Bibasilar changes as well as small pulmonary nodules. The overall appearance is stable from the prior CT. Electronically Signed   By: Inez Catalina M.D.   On: 01/13/2018 19:09   Ct Head Wo Contrast  Result Date: 01/13/2018 CLINICAL DATA:  Altered mental status and decreased motor activity for several hours EXAM: CT HEAD WITHOUT CONTRAST TECHNIQUE: Contiguous axial images were obtained from the base of the skull through the vertex without intravenous contrast. COMPARISON:  03/16/2015 FINDINGS: Brain: No evidence of acute infarction,  hemorrhage, hydrocephalus, extra-axial collection or mass lesion/mass effect. Vascular: No hyperdense vessel or unexpected calcification. Skull: Normal. Negative for fracture or focal lesion. Sinuses/Orbits: No acute finding. Other: Stable subcutaneous soft tissue lesion in the right posterior parietal region similar to that noted on prior exam. This likely represents a sebaceous cyst. IMPRESSION: No acute abnormality noted. Stable subcutaneous lesion on the right as described. Electronically Signed   By: Inez Catalina M.D.   On: 01/13/2018 17:32   ASSESSMENT AND PLAN:  Tim Potter  is a 62 y.o. male who presents with an episode of confusion at home today.  Patient has metastatic hepatocellular carcinoma, and has started just this week on chemotherapy.  Family states that he has had very poor appetite.  On evaluation here in the ED he is more alert, but has some AK I  1.  AKI (acute kidney injury) ([place]) -renal function improved slightly with IV boluses in ED, but did not correct and normal.  - Patient received V fluids gently throughout the night tonight and avoid nephrotoxins. Creatinine much improved.  Patient feels a little better.   2.  Hepatocellular carcinoma [facility]) -oncology and palliative care consults for clarification of goals of care Appreciate oncology consultation.  Paper patient has-progressive hepatocellular carcinoma as noted on CT scan done yesterday. -Oncology has discussed with patient and sister and similar conversation I had with the sister.  Will increase fentanyl patch to 25 mcg, oxycodone IR as needed for pain. -Discussed hospice services at home both patient and sister are agreeable -Palliative care consultation placed  3.  HTN (hypertension) -continue home meds  If remains stable patient will discharge tomorrow to home with hospice  Discussed with Dr. Janese Banks Case discussed with Care Management/Social Worker. Management plans discussed with the patient, family and they are  in agreement.  CODE STATUS: DNR  DVT Prophylaxis: Lovenox  TOTAL TIME TAKING CARE OF THIS PATIENT: *25* minutes.  >50% time spent on counselling and coordination of care  POSSIBLE D/C IN 1 DAYS, DEPENDING ON CLINICAL CONDITION.  Note: This dictation was prepared with Dragon dictation along with smaller phrase technology. Any transcriptional errors that result from this process are unintentional.  Fritzi Mandes M.D on 01/14/2018 at 4:23 PM  Between 7am to 6pm - Pager - [phone]  After 6pm go to [email or web address] - password [email or web address]  [facility]  Office  [phone]  CC: Primary care physician; Patient, No Pcp PerPatient ID: Tim Potter, male   DOB: 06/08/1956, 62 y.o.   MRN: [id number]

## 2018-01-14 NOTE — Consult Note (Signed)
Consultation Note Date: 01/14/2018   Patient Name: Tim Potter  DOB: [birth year]  MRN: [id number]  Age / Sex: 62 y.o., male  PCP: Patient, No Pcp Per Referring Physician: Fritzi Mandes, MD  Reason for Consultation: Establishing goals of care and Hospice Evaluation  HPI/Patient Profile: 62 y.o. male  with past medical history of hepatocellular cancer, hepatitis C, hypertension, gout, and gallstones admitted on 01/13/2018 with altered mental status. In ED, patient with AKI and started on IVF. Patient followed by Dr. 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 Recent hospital admission with CT scan revealing disease progression. Also evidence of tumor thrombus up to IVC and PE. Started on Lovenox injections. Palliative medicine consultation for goals of care/hospice discussion.   Clinical Assessment and Goals of Care:  I have reviewed medical records, discussed with Dr. Janese Banks and Dr. Posey Pronto, and met with patient and sister Everlene Farrier) to discuss diagnosis, prognosis, [place], EOL wishes, disposition and options.  [name] will wake to voice and answer questions but very drowsy throughout the conversation. He tells me his pain is a 5 at this time.   Introduced Palliative Medicine as specialized medical care for people living with serious illness. It focuses on providing relief from the symptoms and stress of a serious illness. The goal is to improve quality of life for both the patient and the family.  We discussed a brief life review of the patient.  Everlene Farrier describes him as a very "proud" man, one that does not like for others to care for him. Prior to cancer diagnoses, he worked with Biomedical scientist. 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 It has been hard for [name] to accept the need for caregivers. Diagnosed with liver cancer in November 2018.   Discussed events leading  up to hospitalization, diagnoses, and interventions. Everlene Farrier and [name] have a good understanding of progressive cancer and poor prognosis.   I attempted to elicit values and goals of care important to the patient and sister. Everlene Farrier tells me they have chosen to pursue "comfort" focused care, explaining that he is physically unable to take further oncology treatment. He speaks of wanting pain relief and wanting to go home.  Discussed comfort focused care and hospice philosophy with goal of comfort, quality, and dignity at EOL. Discussed current medication regimen that was just increased by Dr. Janese Banks (Fentanyl 28mg patch and Oxy 140mq4h prn pain). I explained that hospice services will monitor symptom management and make medications adjustments as needed.   Discussed hospice options. MiEverlene Farrieras recently moved [name] her home to care for him. She is agreeable with home hospice services to facilitate comfort measures and symptom management.   MiEverlene Farrierpeaks of being a caregiver for many different patients since she was [age]ears old. Many of her patients have been on hospice services. She also speaks of losing a sister and brother within 6 months. She is tearful thinking of losing [name] also realistic in the fact that she cannot  change his progressive cancer.   Emotional and spiritual support provided. Questions and concerns addressed.    SUMMARY OF RECOMMENDATIONS    DNR/DNI  Continue current interventions. Plan is home with hospice services, likely tomorrow.   Symptom management--see below.   Code Status/Advance Care Planning:  DNR  Symptom Management:   Fentanyl 84mg TD q72h  Oxycodone 153mPO q4h prn pain/dyspnea  Senna 1tab PO BID  Palliative Prophylaxis:   Bowel Regimen and Frequent Pain Assessment  Additional Recommendations (Limitations, Scope, Preferences):  DNR/DNI. Plan for home hospice.  Psycho-social/Spiritual:   Desire for further Chaplaincy  support:yes  Additional Recommendations: Caregiving  Support/Resources and Education on Hospice  Prognosis:   Likely weeks with progressive metastatic hepatocellular carcinoma with declining functional and nutritional status.  Discharge Planning: Home with Hospice      Primary Diagnoses: Present on Admission: . Hepatocellular carcinoma ([place]. AKI (acute kidney injury) ([place]. HTN (hypertension) . Cancer, hepatocellular ([place]  I have reviewed the medical record, interviewed the patient and family, and examined the patient. The following aspects are pertinent.  Past Medical History:  Diagnosis Date  . [place] ([place]   hepatocellular carcinoma  . Carpal tunnel syndrome   . Gallstones   . Gout   . Hepatitis C 11/09/2017  . Hypertension    Social History   Socioeconomic History  . Marital status: Single    Spouse name: None  . Number of children: None  . Years of education: None  . Highest education level: None  Social Needs  . Financial resource strain: None  . Food insecurity - worry: None  . Food insecurity - inability: None  . Transportation needs - medical: None  . Transportation needs - non-medical: None  Occupational History  . None  Tobacco Use  . Smoking status: Light Tobacco Smoker  . Smokeless tobacco: Never Used  Substance and Sexual Activity  . Alcohol use: Yes    Comment: 2-24 oz beer 4X per week  . Drug use: No  . Sexual activity: None  Other Topics Concern  . None  Social History Narrative  . None   Family History  Problem Relation Age of Onset  . Cancer Mother   . CVA Father    Scheduled Meds: . amLODipine  5 mg Oral Daily  . enoxaparin  1.5 mg/kg Subcutaneous Q24H  . fentaNYL  25 mcg Transdermal Q72H  . isosorbide mononitrate  30 mg Oral Daily  . OLANZapine  10 mg Oral QHS  . senna  1 tablet Oral BID   Continuous Infusions: PRN Meds:.acetaminophen **OR** acetaminophen, morphine injection, ondansetron (ZOFRAN) IV,  oxyCODONE Medications Prior to Admission:  Prior to Admission medications   Medication Sig Start Date End Date Taking? Authorizing Provider  amLODipine (NORVASC) 5 MG tablet Take 1 tablet (5 mg total) by mouth daily. 11/09/17  Yes KaGladstone LighterMD  enoxaparin (LOVENOX) 100 MG/ML injection Inject 1 mL (100 mg total) into the skin daily. 01/05/18  Yes Gouru, ArIllene SilverMD  fentaNYL (DURAGESIC - DOSED MCG/HR) 12 MCG/HR Place 1 patch (12.5 mcg total) onto the skin every 3 (three) days. 01/11/18  Yes RaSindy GuadeloupeMD  isosorbide mononitrate (IMDUR) 30 MG 24 hr tablet Take 1 tablet (30 mg total) by mouth daily. 01/06/18  Yes Gouru, ArIllene SilverMD  lidocaine-prilocaine (EMLA) cream Apply 1 application topically as needed. Apply small amount to port site at least 1 hour prior to it being accessed, cover with plastic wrap 01/07/18  Yes RaJanese Banks  Weston Anna, MD  megestrol (MEGACE) 40 MG tablet Take 1 tablet (40 mg total) by mouth 2 (two) times daily. 01/05/18  Yes Gouru, Aruna, MD  OLANZapine (ZYPREXA) 10 MG tablet Take 1 tablet (10 mg total) by mouth at bedtime. 01/11/18  Yes Sindy Guadeloupe, MD  ondansetron (ZOFRAN) 4 MG tablet Take 1 tablet (4 mg total) by mouth every 6 (six) hours as needed for nausea or vomiting. 11/30/17  Yes Sindy Guadeloupe, MD  oxyCODONE (OXY IR/ROXICODONE) 5 MG immediate release tablet Take 1 tablet (5 mg total) by mouth every 4 (four) hours as needed for moderate pain, severe pain or breakthrough pain. Patient taking differently: Take 10 mg by mouth every 4 (four) hours as needed for moderate pain, severe pain or breakthrough pain.  12/10/17  Yes Sindy Guadeloupe, MD  polyethylene glycol (MIRALAX / GLYCOLAX) packet Take 17 g by mouth 2 (two) times daily as needed for mild constipation.   Yes [provider]  senna-docusate (SENOKOT-S) 8.6-50 MG tablet Take 1 tablet by mouth at bedtime as needed for mild constipation. 01/05/18  Yes Gouru, Illene Silver, MD  feeding supplement, ENSURE ENLIVE, (ENSURE  ENLIVE) LIQD Take 237 mLs by mouth 3 (three) times daily between meals. 01/05/18   [name], MD   No Known Allergies Review of Systems  Constitutional: Positive for activity change and appetite change.       Chronic abdominal pain  Neurological: Positive for weakness.   Physical Exam  Constitutional: He is oriented to person, place, and time. He is easily aroused. He appears ill.  HENT:  Head: Normocephalic and atraumatic.  Pulmonary/Chest: Effort normal.  Abdominal: There is tenderness.  Neurological: He is alert, oriented to person, place, and time and easily aroused.  drowsy  Skin: Skin is warm and dry.  Psychiatric: His speech is delayed. He is inattentive.  Nursing note and vitals reviewed.  Vital Signs: BP 116/79   Pulse 97   Temp 98.7 F (37.1 C) (Oral)   Resp 16   Ht '5\' 10"'$  (1.778 m)   Wt 68 kg (150 lb)   SpO2 96%   BMI 21.52 kg/m  Pain Assessment: 0-10   Pain Score: 4   SpO2: SpO2: 96 % O2 Device:SpO2: 96 % O2 Flow Rate: .   IO: Intake/output summary:   Intake/Output Summary (Last 24 hours) at 01/14/2018 1445 Last data filed at 01/14/2018 1019 Gross per 24 hour  Intake 3240 ml  Output -  Net 3240 ml    LBM:   Baseline Weight: Weight: 68 kg (150 lb) Most recent weight: Weight: 68 kg (150 lb)     Palliative Assessment/Data: PPS 40%   Flowsheet Rows     Most Recent Value  Intake Tab  Referral Department  Hospitalist  Unit at Time of Referral  Oncology Unit  Palliative Care Primary Diagnosis  Cancer  Date Notified  01/13/18  Palliative Care Type  New Palliative care  Reason for referral  Clarify Goals of Care, Counsel Regarding Hospice  Date first seen by Palliative Care  01/14/18  # of days Palliative referral response time  1 Day(s)  Clinical Assessment  Palliative Performance Scale Score  40%  Psychosocial & Spiritual Assessment  Palliative Care Outcomes  Patient/Family meeting held?  Yes  Who was at the meeting?  patient and sister   Palliative Care Outcomes  Clarified goals of care, Provided end of life care assistance, Provided psychosocial or spiritual support, ACP counseling assistance, Transitioned to hospice, Improved  pain interventions, Counseled regarding hospice      Time In: 1330 Time Out: 1440 Time Total: 70 min Greater than 50%  of this time was spent counseling and coordinating care related to the above assessment and plan.  Signed by:  Ihor Dow, FNP-C Palliative Medicine Team  Phone: [phone] Fax: [phone]    Please contact Palliative Medicine Team phone at [phone] for questions and concerns.  For individual provider: See Shea Evans

## 2018-01-15 MED ORDER — OXYCODONE HCL 5 MG PO TABS: 10.0000 mg | ORAL_TABLET | ORAL | 0 refills | Status: AC | PRN

## 2018-01-15 NOTE — Progress Notes (Signed)
Mr. Eidson is discharged to home with hospice. He has a son who will help care for him at home. Mr. Amon discharged with Rx for Oxy and Fentanyl. Mr. Quiroa denies any concerns or questions, hospice has already called son and made plans to f/u with patient tomorrow. Discharged to home via family. Shift uneventful.

## 2018-01-15 NOTE — Discharge Summary (Signed)
[facility] [place] NAME: Tim Potter    MR#:  [id number]  DATE OF BIRTH:  1956/08/15  DATE OF ADMISSION:  01/13/2018 ADMITTING PHYSICIAN: Lance Coon, MD  DATE OF DISCHARGE: 01/15/2018  PRIMARY CARE PHYSICIAN: Patient, No Pcp Per    ADMISSION DIAGNOSIS:  Dehydration [E86.0] AKI (acute kidney injury) ([place]) [N17.9] Altered mental status, unspecified altered mental status type [R41.82]  DISCHARGE DIAGNOSIS:  Acute renal failure secondary to dehydration improved Hepatocellular carcinoma--- progressive worsening as noted on CT scan of the abdomen Abdominal pain chronic secondary to cancer  SECONDARY DIAGNOSIS:   Past Medical History:  Diagnosis Date  . Cancer ([place]) 12/16/2017   hepatocellular carcinoma  . Carpal tunnel syndrome   . Gallstones   . Gout   . Hepatitis C 11/09/2017  . Hypertension     HOSPITAL COURSE:   JamesHolmesis a62 y.o.malewho presents with an episode of confusion at home today. Patient has metastatic hepatocellular carcinoma, and has started just this week on chemotherapy. Family states that he has had very poor appetite. On evaluation here in the ED he is more alert, but has some AK I  1.AKI (acute kidney injury) ([place]) -renal function improved  -Patient received IV fluids  - avoid nephrotoxins. Creatinine much improved.  Patient feels a little better.   2.Hepatocellular carcinoma [facility])  - palliative care consultsfor clarification of goals of care--pt DNR Appreciate oncology consultation.  patient has-progressive hepatocellular carcinoma as noted on CT scan done in ER. -Oncology has discussed with patient and sister and similar conversation I had with the sister.  Will increase fentanyl patch to 25 mcg, oxycodone IR as needed for pain. -Discussed hospice services at home both patient and sister are agreeable -Palliative care consultation placed--appreciate input  3.HTN  (hypertension) -continue home meds   patient will discharge today to home with hospice. D/w son in the room today   CONSULTS OBTAINED:  Treatment Team:  Sindy Guadeloupe, MD  DRUG ALLERGIES:  No Known Allergies  DISCHARGE MEDICATIONS:   Allergies as of 01/15/2018   No Known Allergies     Medication List    TAKE these medications   amLODipine 5 MG tablet Commonly known as:  NORVASC Take 1 tablet (5 mg total) by mouth daily.   enoxaparin 100 MG/ML injection Commonly known as:  LOVENOX Inject 1 mL (100 mg total) into the skin daily.   feeding supplement (ENSURE ENLIVE) Liqd Take 237 mLs by mouth 3 (three) times daily between meals.   fentaNYL 12 MCG/HR Commonly known as:  DURAGESIC - dosed mcg/hr Place 1 patch (12.5 mcg total) onto the skin every 3 (three) days.   isosorbide mononitrate 30 MG 24 hr tablet Commonly known as:  IMDUR Take 1 tablet (30 mg total) by mouth daily.   lidocaine-prilocaine cream Commonly known as:  EMLA Apply 1 application topically as needed. Apply small amount to port site at least 1 hour prior to it being accessed, cover with plastic wrap   megestrol 40 MG tablet Commonly known as:  MEGACE Take 1 tablet (40 mg total) by mouth 2 (two) times daily.   OLANZapine 10 MG tablet Commonly known as:  ZYPREXA Take 1 tablet (10 mg total) by mouth at bedtime.   ondansetron 4 MG tablet Commonly known as:  ZOFRAN Take 1 tablet (4 mg total) by mouth every 6 (six) hours as needed for nausea or vomiting.   oxyCODONE 5 MG immediate release tablet Commonly known as:  Oxy IR/ROXICODONE Take 2 tablets (10 mg total) by mouth every 4 (four) hours as needed for moderate pain, severe pain or breakthrough pain.   polyethylene glycol packet Commonly known as:  MIRALAX / GLYCOLAX Take 17 g by mouth 2 (two) times daily as needed for mild constipation.   senna-docusate 8.6-50 MG tablet Commonly known as:  Senokot-S Take 1 tablet by mouth at bedtime as needed  for mild constipation.       If you experience worsening of your admission symptoms, develop shortness of breath, life threatening emergency, suicidal or homicidal thoughts you must seek medical attention immediately by calling 911 or calling your MD immediately  if symptoms less severe.  You Must read complete instructions/literature along with all the possible adverse reactions/side effects for all the Medicines you take and that have been prescribed to you. Take any new Medicines after you have completely understood and accept all the possible adverse reactions/side effects.   Please note  You were cared for by a hospitalist during your hospital stay. If you have any questions about your discharge medications or the care you received while you were in the hospital after you are discharged, you can call the unit and asked to speak with the hospitalist on call if the hospitalist that took care of you is not available. Once you are discharged, your primary care physician will handle any further medical issues. Please note that NO REFILLS for any discharge medications will be authorized once you are discharged, as it is imperative that you return to your primary care physician (or establish a relationship with a primary care physician if you do not have one) for your aftercare needs so that they can reassess your need for medications and monitor your lab values. Today   SUBJECTIVE   Some abdominal pain-chronic  VITAL SIGNS:  Blood pressure 115/74, pulse (!) 104, temperature 98.1 F (36.7 C), temperature source Oral, resp. rate 18, height 5\' 10"  (1.778 m), weight 68 kg (150 lb), SpO2 96 %.  I/O:    Intake/Output Summary (Last 24 hours) at 01/15/2018 0935 Last data filed at 01/15/2018 0232 Gross per 24 hour  Intake 240 ml  Output 275 ml  Net -35 ml    PHYSICAL EXAMINATION:  GENERAL:  62 y.o.-year-old patient lying in the bed with no acute distress. Appears chronically ill EYES: Pupils  equal, round, reactive to light and accommodation. No scleral icterus. Extraocular muscles intact.  HEENT: Head atraumatic, normocephalic. Oropharynx and nasopharynx clear.  NECK:  Supple, no jugular venous distention. No thyroid enlargement, no tenderness.  LUNGS: Normal breath sounds bilaterally, no wheezing, rales,rhonchi or crepitation. No use of accessory muscles of respiration.  CARDIOVASCULAR: S1, S2 normal. No murmurs, rubs, or gallops.  ABDOMEN: Soft, non-tender, non-distended. Bowel sounds present. No organomegaly or mass.  EXTREMITIES: No pedal edema, cyanosis, or clubbing.  NEUROLOGIC: Cranial nerves II through XII are intact. Muscle strength 5/5 in all extremities. Sensation intact. Gait not checked.  PSYCHIATRIC:  patient is alert and oriented x 3.  SKIN: No obvious rash, lesion, or ulcer.   DATA REVIEW:   CBC  Recent Labs  Lab 01/13/18 1600  WBC 12.5*  HGB 17.2  HCT 52.9*  PLT 129*    Chemistries  Recent Labs  Lab 01/13/18 1600  01/14/18 0801  NA 132*   < > 137  K 5.7*   < > 4.3  CL 104   < > 107  CO2 16*   < > 23  GLUCOSE  159*   < > 117*  BUN QUANTITY NOT SUFFICIENT, UNABLE TO PERFORM TEST   < > 37*  CREATININE 1.85*   < > 1.42*  CALCIUM 8.3*   < > 8.0*  AST 97*  --   --   ALT 33  --   --   ALKPHOS 138*  --   --   BILITOT 2.9*  --   --    < > = values in this interval not displayed.    Microbiology Results   No results found for this or any previous visit (from the past 240 hour(s)).  RADIOLOGY:  Dg Chest 1 View  Result Date: 01/13/2018 CLINICAL DATA:  Altered mental status, history of hepatocellular carcinoma EXAM: CHEST 1 VIEW COMPARISON:  11/08/2017 FINDINGS: Cardiac shadow is stable. Right chest wall port is seen. Mild bibasilar atelectatic changes are seen. Scattered small nodular densities are noted within the mid right lung similar to some nodule seen on prior exam. No new focal abnormality is seen. IMPRESSION: Bibasilar changes as well as  small pulmonary nodules. The overall appearance is stable from the prior CT. Electronically Signed   By: Inez Catalina M.D.   On: 01/13/2018 19:09   Ct Head Wo Contrast  Result Date: 01/13/2018 CLINICAL DATA:  Altered mental status and decreased motor activity for several hours EXAM: CT HEAD WITHOUT CONTRAST TECHNIQUE: Contiguous axial images were obtained from the base of the skull through the vertex without intravenous contrast. COMPARISON:  03/16/2015 FINDINGS: Brain: No evidence of acute infarction, hemorrhage, hydrocephalus, extra-axial collection or mass lesion/mass effect. Vascular: No hyperdense vessel or unexpected calcification. Skull: Normal. Negative for fracture or focal lesion. Sinuses/Orbits: No acute finding. Other: Stable subcutaneous soft tissue lesion in the right posterior parietal region similar to that noted on prior exam. This likely represents a sebaceous cyst. IMPRESSION: No acute abnormality noted. Stable subcutaneous lesion on the right as described. Electronically Signed   By: Inez Catalina M.D.   On: 01/13/2018 17:32     Management plans discussed with the patient, family and they are in agreement.  CODE STATUS:     Code Status Orders  (From admission, onward)        Start     Ordered   01/14/18 0114  Do not attempt resuscitation (DNR)  Continuous    Question Answer Comment  In the event of cardiac or respiratory ARREST Do not call a "code blue"   In the event of cardiac or respiratory ARREST Do not perform Intubation, CPR, defibrillation or ACLS   In the event of cardiac or respiratory ARREST Use medication by any route, position, wound care, and other measures to relive pain and suffering. May use oxygen, suction and manual treatment of airway obstruction as needed for comfort.      01/14/18 0113    Code Status History    Date Active Date Inactive Code Status Order ID Comments User Context   01/02/2018 12:15 01/05/2018 18:12 Full Code [id number]  Demetrios Loll, MD  Inpatient   11/07/2017 17:13 11/09/2017 15:34 Full Code [id number]  Loletha Grayer, MD ED      TOTAL TIME TAKING CARE OF THIS PATIENT: *40* minutes.    Fritzi Mandes M.D on 01/15/2018 at 9:35 AM  Between 7am to 6pm - Pager - [phone] After 6pm go to [email or web address] - password [email or web address] [place] [facility]  Office  [phone]  CC: Primary care physician; Patient, No Pcp Per

## 2018-01-15 NOTE — Care Management Note (Signed)
Case Management Note  Patient Details  Name: Tim Potter MRN: [id number] Date of Birth: [birth year]  Subjective/Objective:    [facility] that Tim Potter is being discharged home today with new Hospice services. Sister is requesting that a hospital bed be delivered to the home on Monday 01/17/18 by Hospice.               Action/Plan:   Expected Discharge Date:  01/15/18               Expected Discharge Plan:  Home w Hospice Care  In-House Referral:     Discharge planning Services  CM Consult  Post Acute Care Choice:  Hospice Choice offered to:  Sibling, Patient  DME Arranged:    DME Agency:     HH Arranged:  (In-home hospice services) [place] Agency:  [facility]  Status of Service:  Completed, signed off  If discussed at [place] of Stay Meetings, dates discussed:    Additional Comments:  Tim Potter A, RN 01/15/2018, 9:53 AM

## 2018-01-18 ENCOUNTER — Inpatient Hospital Stay: Payer: Medicaid Other

## 2018-01-18 ENCOUNTER — Inpatient Hospital Stay: Payer: Medicaid Other | Admitting: Nurse Practitioner

## 2018-01-19 ENCOUNTER — Telehealth: Payer: Self-pay | Admitting: *Deleted

## 2018-01-19 NOTE — Telephone Encounter (Signed)
I spoke with [place] who states she has not had the chance to deal with this yet. She did report that Hospice will pay for Eliquis. She will get back to me later , may be tomorrow

## 2018-01-19 NOTE — Telephone Encounter (Signed)
Hospice nurse called stating Lovenox injections patient is getting is expensive and is asking if he can be changed to Eliquis instead. He has 2 more days of Lovenox 100 mg left. Please advise

## 2018-01-19 NOTE — Telephone Encounter (Signed)
Tim Potter reports that Hospice will NOT pay for Lovenox so if we don't change it, he will not have any medicine for PE.

## 2018-01-19 NOTE — Telephone Encounter (Signed)
If patient is ok with comfort measures and foregoing anticoagulation that's fine. If he wants to continue something and if hospice can do Coumadin that's an option.

## 2018-01-20 ENCOUNTER — Other Ambulatory Visit: Payer: Self-pay | Admitting: *Deleted

## 2018-01-20 ENCOUNTER — Other Ambulatory Visit: Payer: Self-pay | Admitting: Oncology

## 2018-01-20 MED ORDER — FENTANYL 50 MCG/HR TD PT72: 50.0000 ug | MEDICATED_PATCH | TRANSDERMAL | 0 refills | Status: AC

## 2018-01-20 MED ORDER — MORPHINE SULFATE (CONCENTRATE) 20 MG/ML PO SOLN: ORAL | 0 refills | Status: AC

## 2018-01-20 MED ORDER — FENTANYL 50 MCG/HR TD PT72: 50.0000 ug | MEDICATED_PATCH | TRANSDERMAL | 0 refills | Status: DC

## 2018-01-20 NOTE — Telephone Encounter (Signed)
Per PO Dr Janese Banks ok to increase Fentanyl to 50 mcg and ok for Roxanol, She will escribe prescription

## 2018-01-20 NOTE — Telephone Encounter (Signed)
Andrea notified.

## 2018-01-20 NOTE — Telephone Encounter (Signed)
Per Rosanne Ashing, family has opted to d/c Lovenox not start Eliquis and just to a baby ASA daily.

## 2018-01-20 NOTE — Telephone Encounter (Signed)
Patient having increased pain and does not take the Oxycodone unless family goers over and pushes him to take it. Asking if his Fentanyl patch can be increased to 50 mcg (currently on 25 mcg) Also reports that the comfort orders have not been returned to them yet and want prescription for Roxanol. Reports that he  Is also having shortness of breath.  Please advise

## 2018-01-25 ENCOUNTER — Other Ambulatory Visit: Payer: Medicaid Other

## 2018-01-25 ENCOUNTER — Ambulatory Visit: Payer: Medicaid Other

## 2018-01-25 ENCOUNTER — Telehealth: Payer: Self-pay | Admitting: *Deleted

## 2018-01-25 ENCOUNTER — Ambulatory Visit: Payer: Medicaid Other | Admitting: Oncology

## 2018-01-25 MED ORDER — FUROSEMIDE 20 MG PO TABS: 20.0000 mg | ORAL_TABLET | Freq: Every day | ORAL | 2 refills | Status: AC | PRN

## 2018-01-25 NOTE — Telephone Encounter (Signed)
Hospice nurse called to request lasix for bilateral LE swelling. Per Dr Grayland Ormond since Lasix is on the comfort orders ok for them to order lasix. Teressa informed and will call it

## 2018-01-27 ENCOUNTER — Telehealth: Payer: Self-pay | Admitting: *Deleted

## 2018-01-27 NOTE — Telephone Encounter (Signed)
Patient running temp 100.5 today and he is having visual hallucinations of seeing things in his hands. He is slower to respond than normal. She called in Tylenol Suppositories. Please advise of any orders

## 2018-01-27 NOTE — Telephone Encounter (Signed)
His bilirubin likely going up and causing hallucinations. Unless he is agitated, I would not add any other medications at this time

## 2018-01-27 NOTE — Telephone Encounter (Signed)
Tim Potter informed of doctor reply

## 2018-01-28 ENCOUNTER — Telehealth: Payer: Self-pay | Admitting: *Deleted

## 2018-01-30 NOTE — Telephone Encounter (Signed)
Also called the number left for me an got voicemail at hospice and left message that if they send me forms or tell me what to send in as order I will do it . Left my direct number.

## 2018-01-31 ENCOUNTER — Telehealth: Payer: Self-pay | Admitting: *Deleted

## 2018-01-31 NOTE — Telephone Encounter (Signed)
Called family back and his son states that he does still need FMLA for the 4 days he missed from work taking care of his Dad. His dad passed away on 2018/02/15 with hospice. His work will not excuse his absence unless he has FMLA papers.  I have the dates as Jan 15 while pt was in hosp. And 1/24 second time he was in [place]. And then 2/7 and 2023/02/15 when he was in hospice care and actively dying.  He will bring paper over and we will get them filled out and he would like them to be faxed to the number of the forms.

## 2018-02-15 ENCOUNTER — Telehealth: Payer: Self-pay | Admitting: *Deleted

## 2018-02-15 ENCOUNTER — Other Ambulatory Visit: Payer: Self-pay | Admitting: *Deleted

## 2018-02-15 NOTE — Telephone Encounter (Signed)
Left voice mail messages x 2 with [name] at [employer] resources department to call me about Tim Potter' FMLA claim and that he needs to be placed in their eligibility file for [employer].     dhs

## 2018-02-15 NOTE — Telephone Encounter (Signed)
Spoke with claims at [place] via teleehone about Tim Potter' FMLA claim. They informed me that Tim Potter is not in the eligibility file for [employer], and that he needs to contact human resources at [place] to have them place him in the eligibility file.  [phone]  Spoke with [name] via telephone and informed him of the above. He verbalized understanding and will contact his human resources tomorrow. He will call us if we can help him further.     dhs

## 2018-02-18 NOTE — Telephone Encounter (Signed)
-----   Message from Wilburn Cornelia sent at 02-18-18 10:49 AM EST ----- Regarding: son needs FMLA filled out Contact: [phone] The son has the forms

## 2018-02-18 NOTE — Telephone Encounter (Signed)
Called pt and left message that if he can bring papers for FMLA and let me know if it is intermittent when he needs to help out vs continuous that he will not work for a weeks to month basis. Left my number to call me back

## 2018-02-18 DEATH — deceased

## 2018-02-22 ENCOUNTER — Telehealth: Payer: Self-pay | Admitting: *Deleted

## 2018-02-22 NOTE — Telephone Encounter (Signed)
Called Tim Potter in [place] and left message. Just wondering if HR has sent electronic message to [place] to make sure he has FMLA benefits so that [place] can send [place] FMLA form for the employee so that he can get approved for his days off.

## 2018-05-31 ENCOUNTER — Telehealth: Payer: Self-pay | Admitting: *Deleted

## 2018-05-31 NOTE — Telephone Encounter (Signed)
Patients' son had sent [place] an authorization for [place] to contact [employer] to fill out fmla for son to be out of work for 3 days while his dad was in the hospital , and then sent to hospice.  We have contacted [employer] to try to get them to send info in for [place] to get FMLA. Left several message with [name] and Nunz[employer] Cory and never a response. This pt. Passed away February 20, 2018. I have contacted the son several times trying to get him to call his HR for asst. No response. I am closing this issue that I can't fix because I do not get anyone to call me back. The [employer] can't give me the FMLA because his HR at [place] has never sent the info to the group.

## 2018-10-26 IMAGING — US US ABDOMEN LIMITED
1 series · 14 of 25 positions shown · non-contrast
Comparison: No prior.

CLINICAL DATA: Abdominal pain.

EXAM:
ULTRASOUND ABDOMEN LIMITED RIGHT UPPER QUADRANT

[Series 1: us abdomen limited · 0.19mm/px · 14 of 65 slices shown]
[im 1/65]
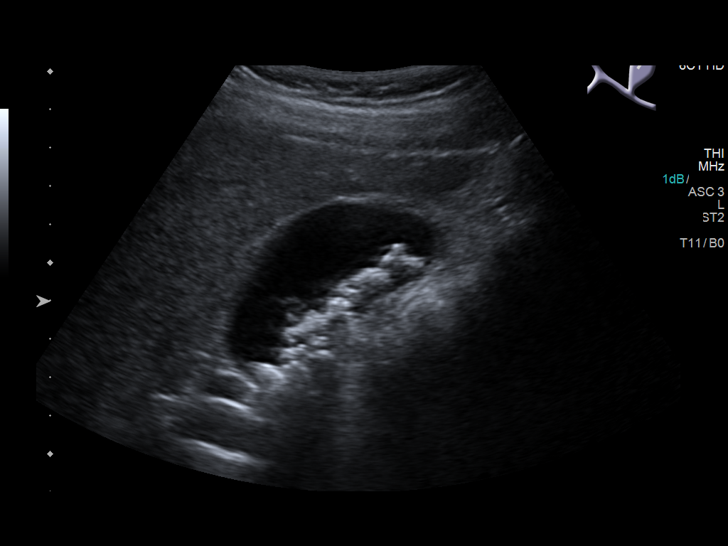
[im 6/65]
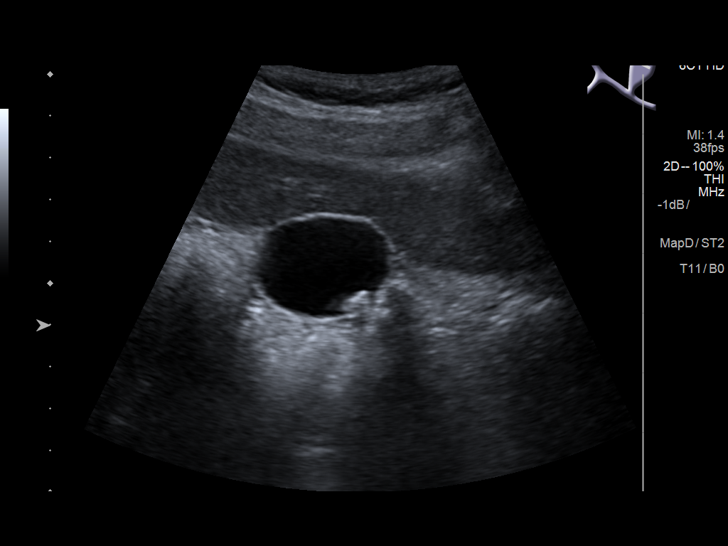
[im 11/65]
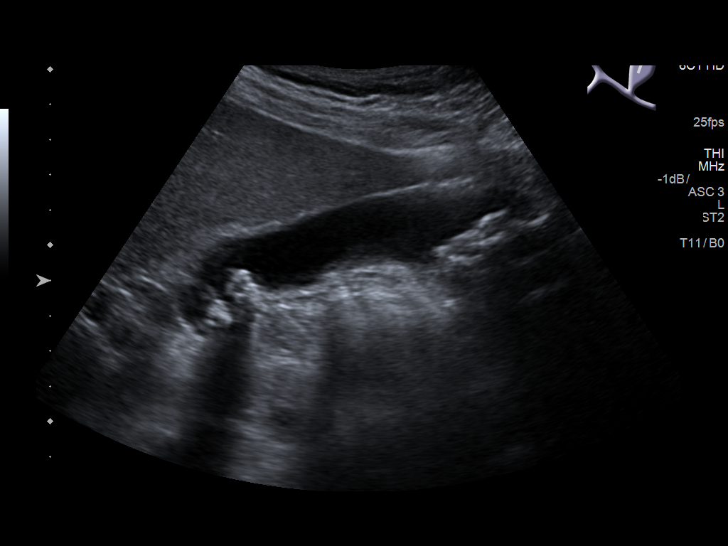
[im 17/65]
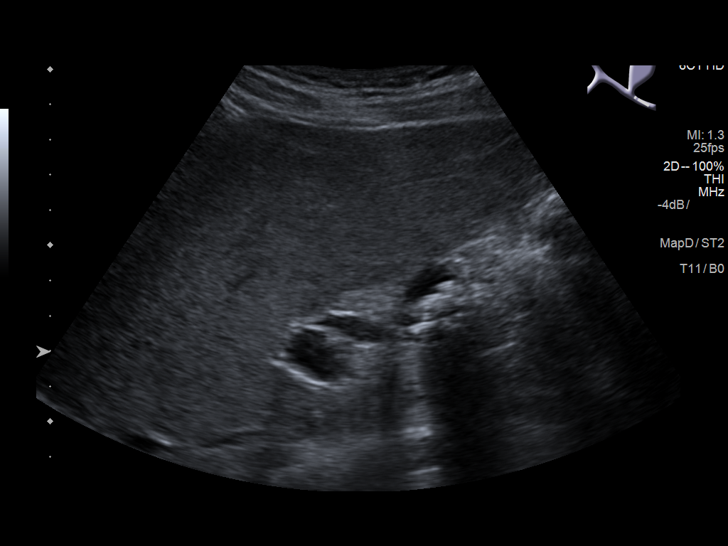
[im 22/65]
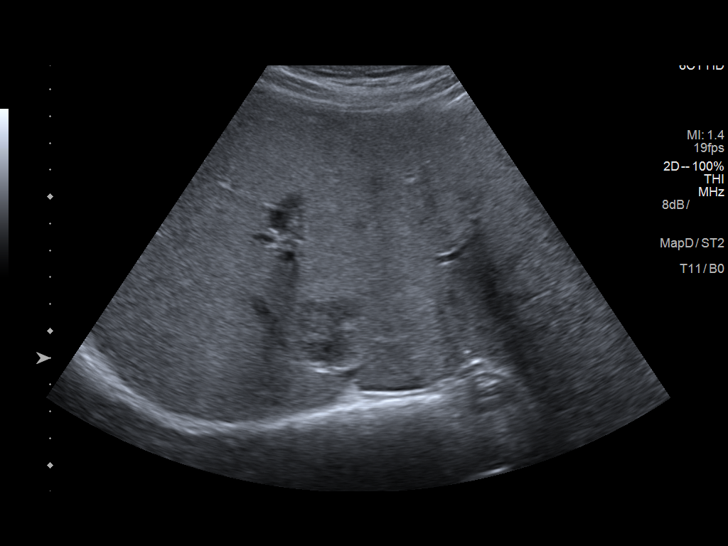
[im 25/65]
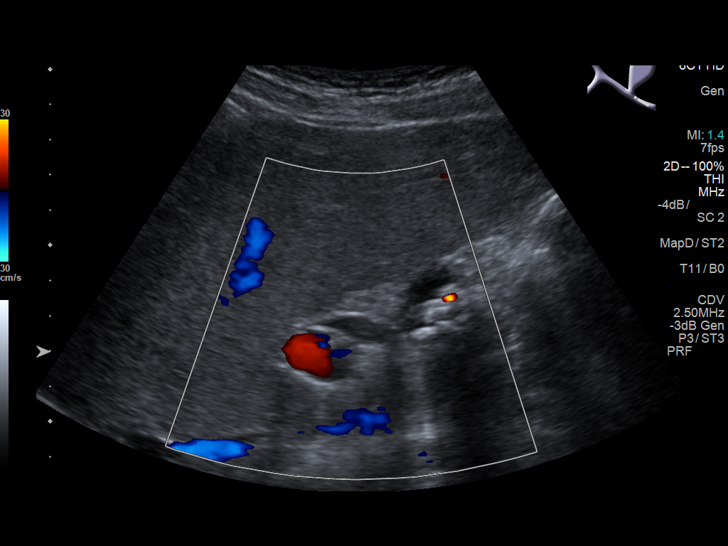
[im 30/65]
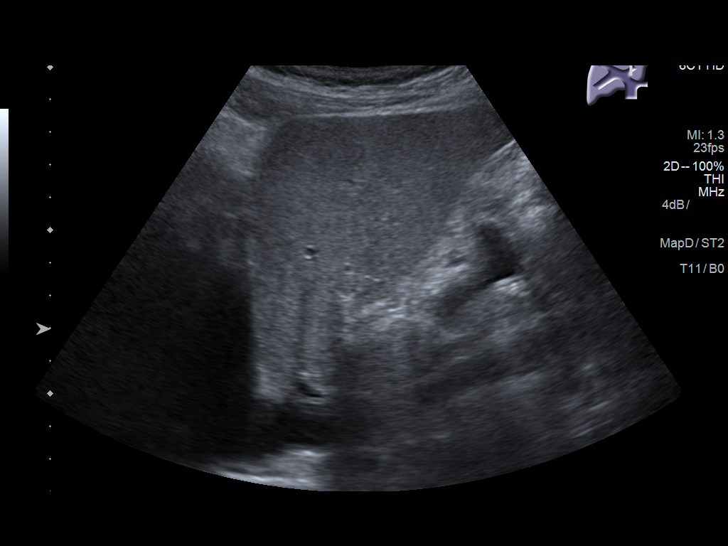
[im 35/65]
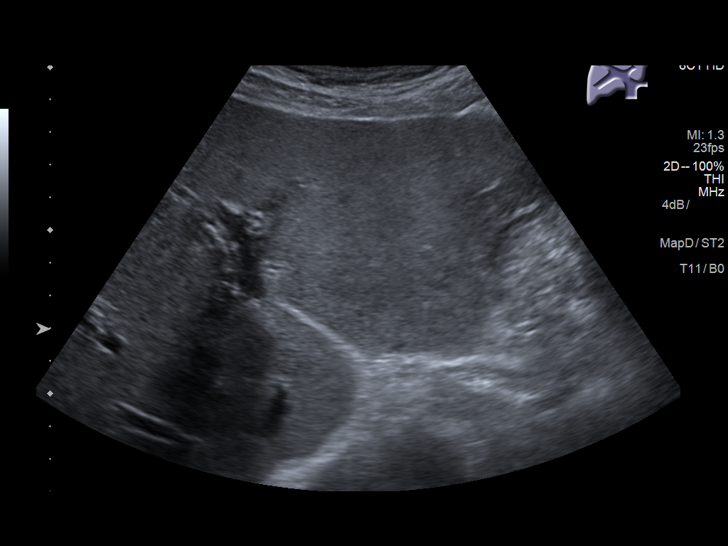
[im 41/65]
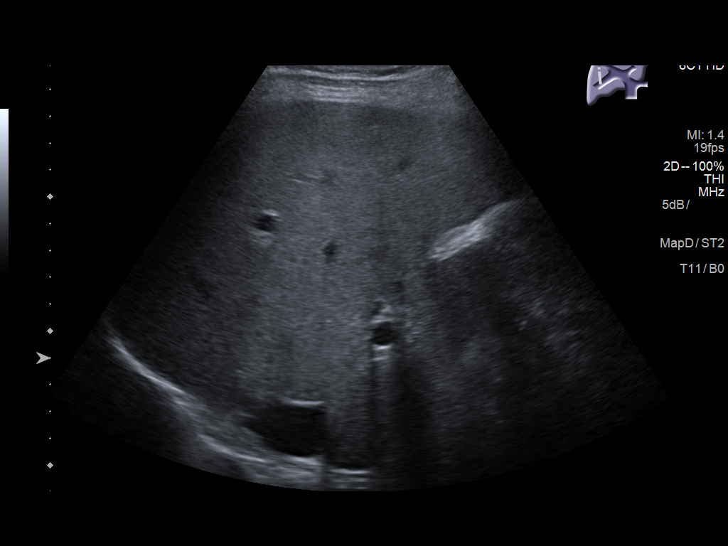
[im 43/65]
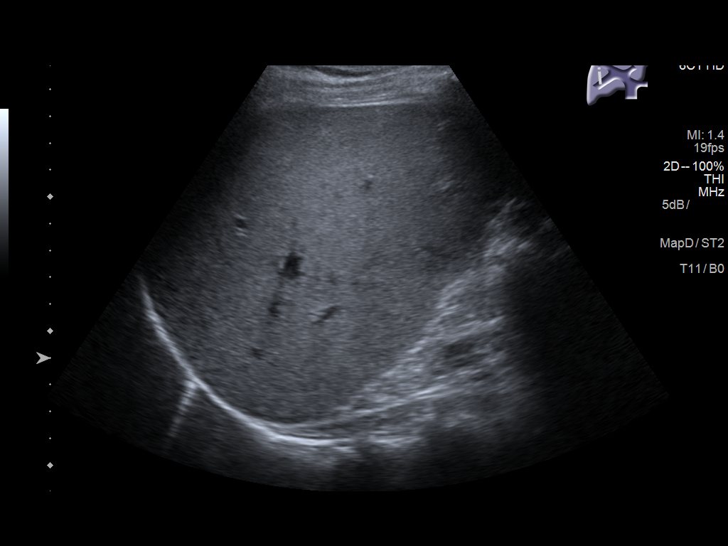
[im 49/65]
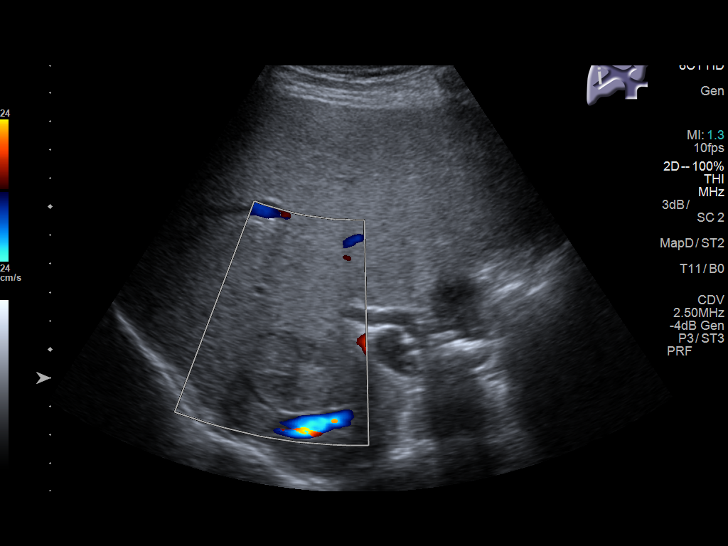
[im 54/65]
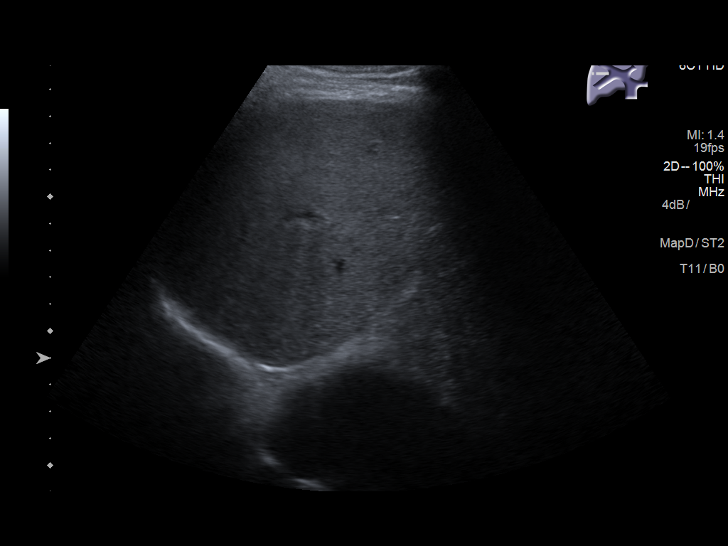
[im 59/65]
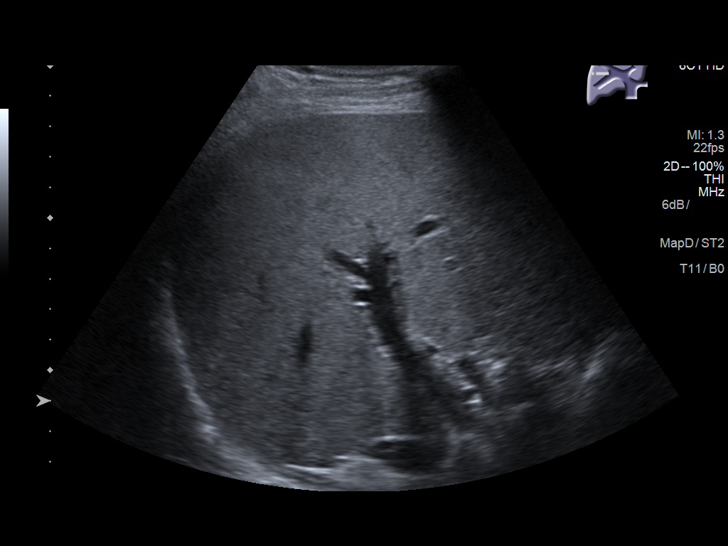
[im 65/65]
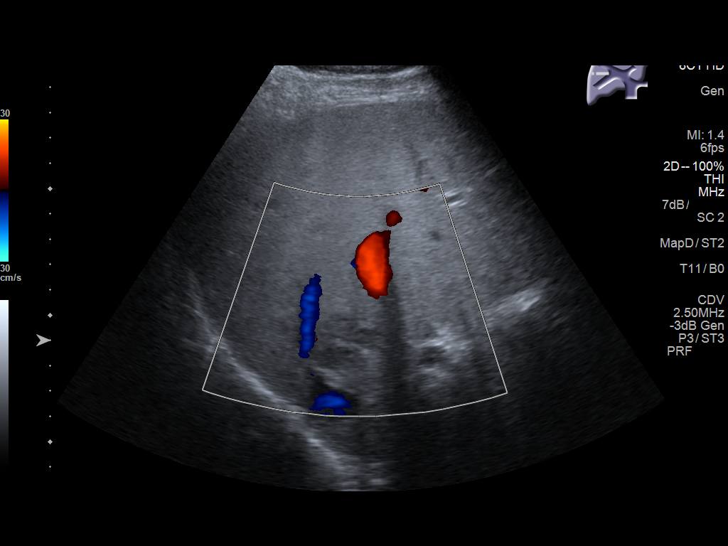

[14 of 25 positions shown; findings below may reference images not displayed]

FINDINGS: Gallbladder:

Multiple gallstones are noted. Gallstones measure up to 9 mm.
Gallbladder wall thickness 1.7 mm.

Common bile duct:

Diameter: 6.7 mm

Liver:

Heterogeneous parenchymal pattern consistent fatty infiltration
and/or hepatocellular disease. 2.7 cm maximum diameter focal area of
decreased attenuation in right hepatic lobe. Although this may
represent focal fatty sparing a focal significant hepatic lesion
cannot be excluded. Gadolinium-enhanced MRI can be obtained to
further evaluate.
IMPRESSION: 1. Multiple gallstones. No evidence of cholecystitis or biliary
distention.

2. Heterogeneous parenchymal pattern consistent with fatty
infiltration and/or hepatocellular disease. 2.7 cm maximum diameter
focal area of decreased attenuation noted in the right hepatic lobe.
Although this may represent focal fatty sparing, focal significant
hepatic lesion cannot be excluded. Gadolinium-enhanced MRI can be
obtained to further evaluate.

## 2019-04-16 IMAGING — CT CT CHEST W/O CM
2 of 3 series · 15 of 36 positions shown, 18 images · non-contrast
Comparison: CT abdomen and pelvis 11/07/2017. MRI abdomen
11/08/2017.

CLINICAL DATA: Recent diagnosis of liver cancer. Concern for
metastatic disease.

EXAM:
CT CHEST WITHOUT CONTRAST
TECHNIQUE: Multidetector CT imaging of the chest was performed following the
standard protocol without IV contrast.

[Series 2: thorax · axial · 0.76mm/px · z∈[-77,+173]mm · 12 of 147 slices shown, 15 images]
[im 11/147  mediastinal]
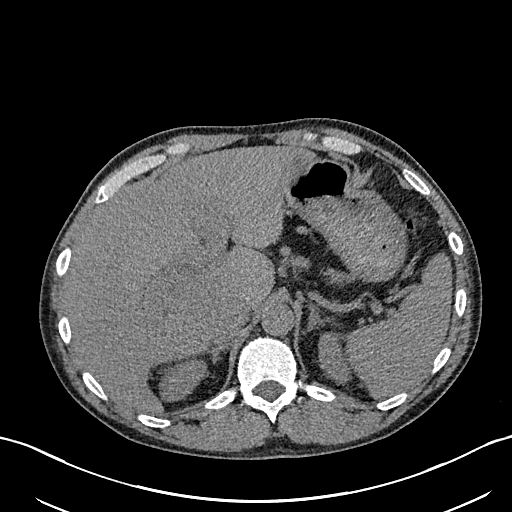
[im 11/147  lung]
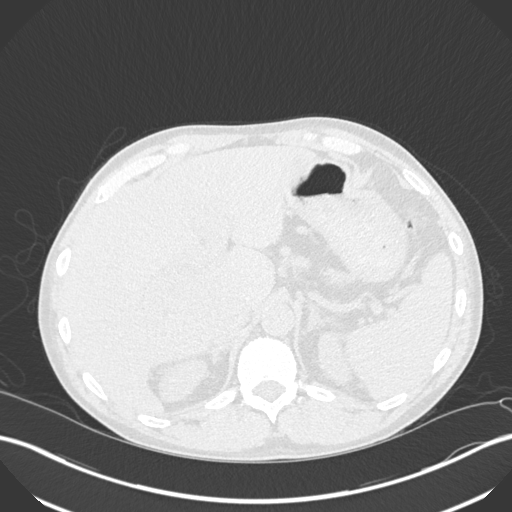
[im 22/147  lung]
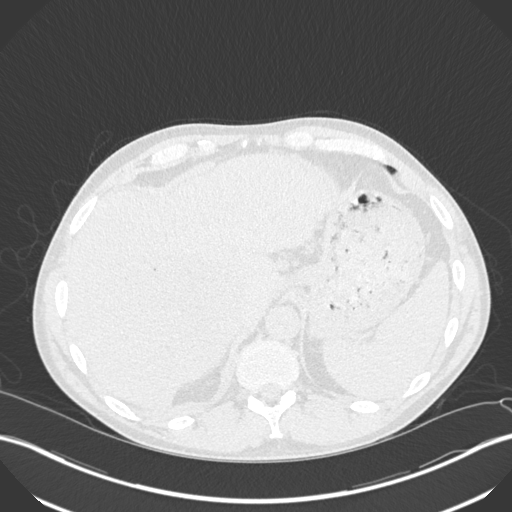
[im 33/147  lung]
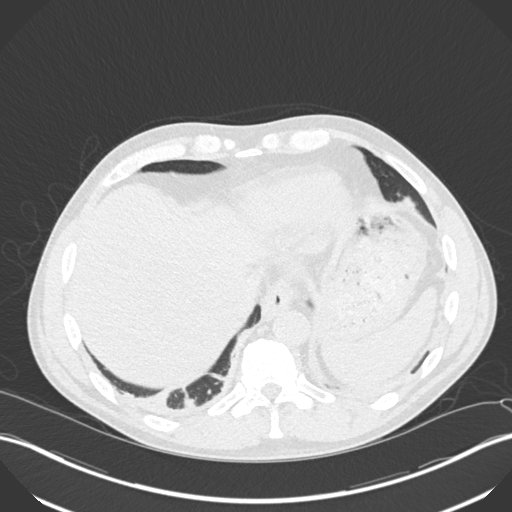
[im 44/147  lung]
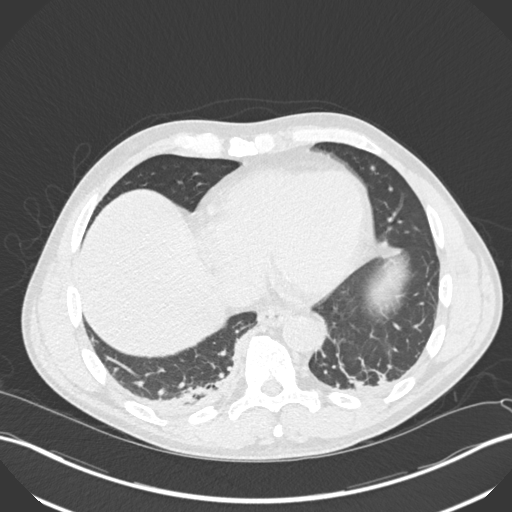
[im 55/147  mediastinal]
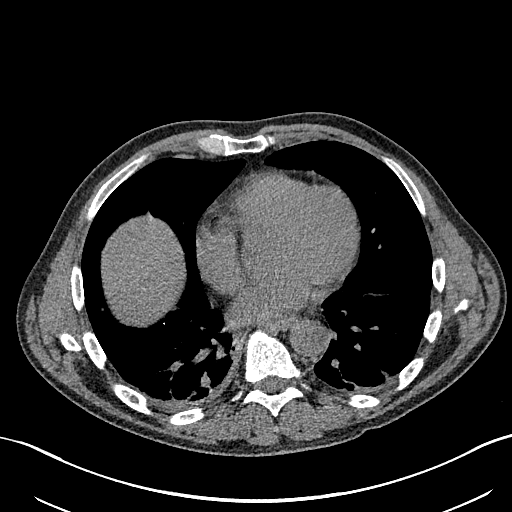
[im 55/147  lung]
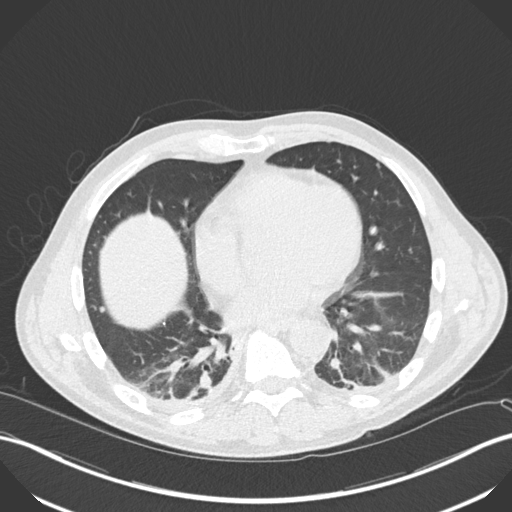
[im 65/147  lung]
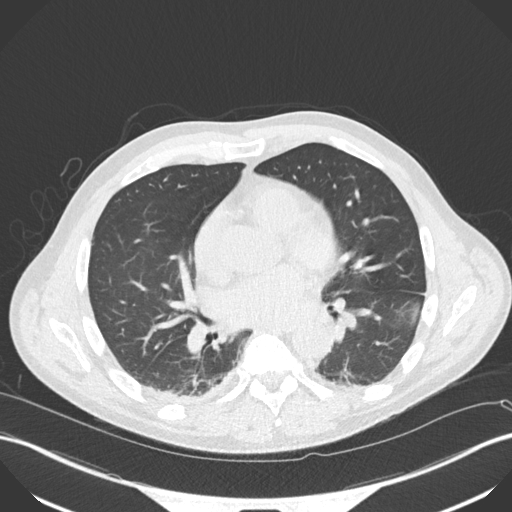
[im 82/147  lung]
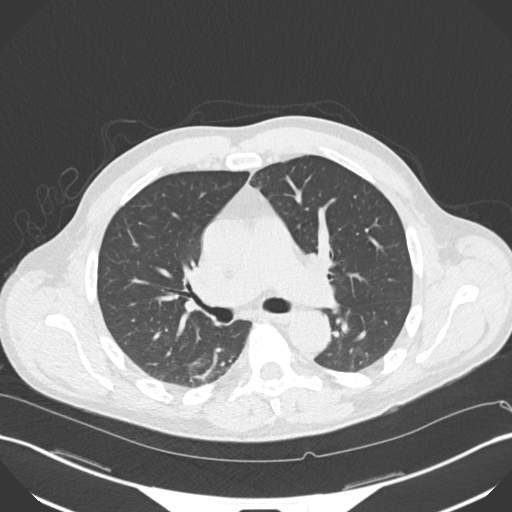
[im 92/147  lung]
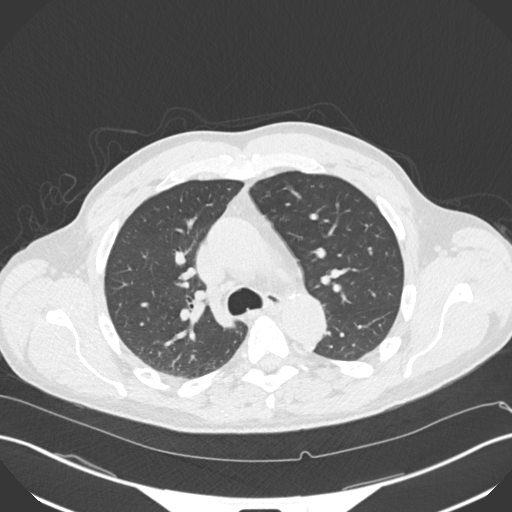
[im 103/147  mediastinal]
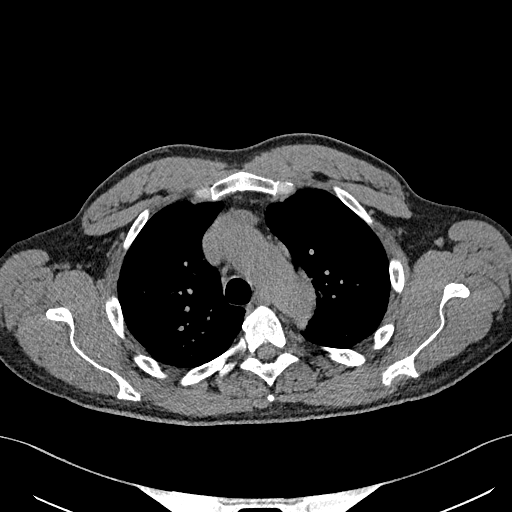
[im 103/147  lung]
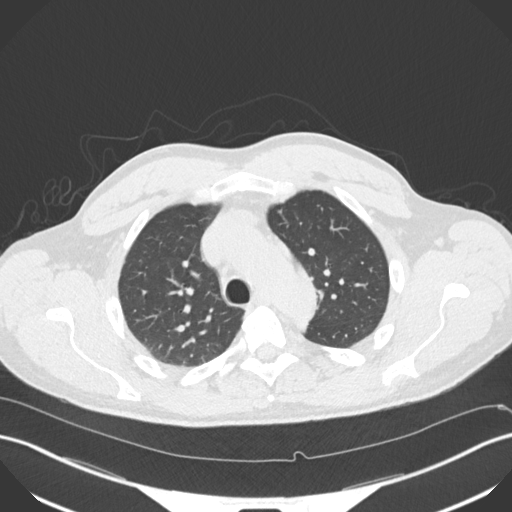
[im 114/147  lung]
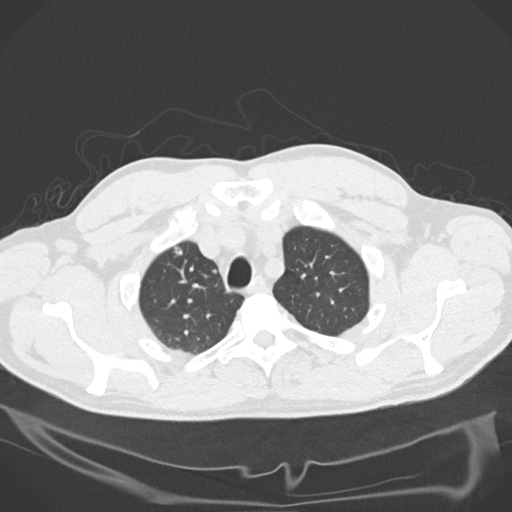
[im 125/147  lung]
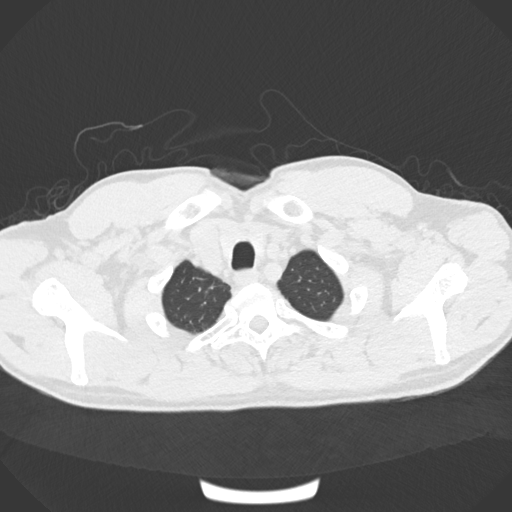
[im 136/147  lung]
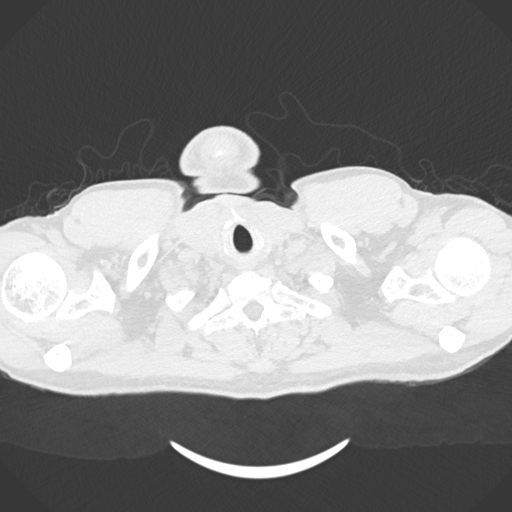

[Series 5: coronal · coronal · 0.59mm/px · 3 of 127 slices shown]
[im 26/127  lung]
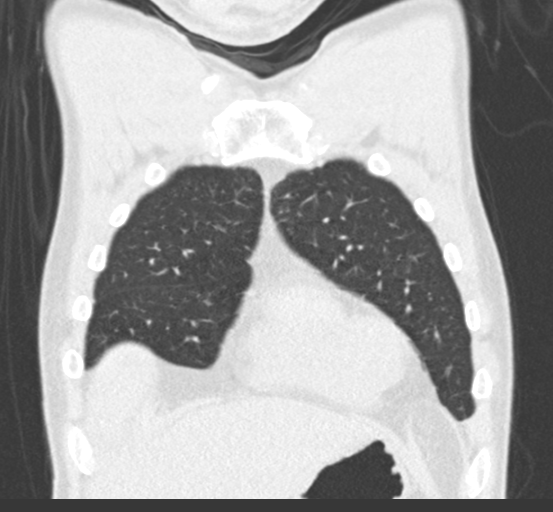
[im 51/127  lung]
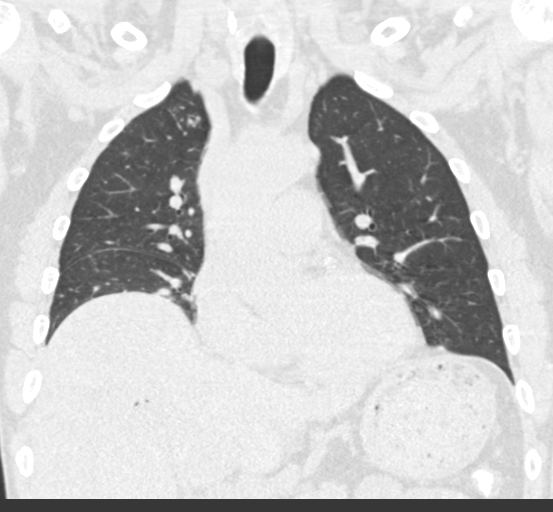
[im 76/127  lung]
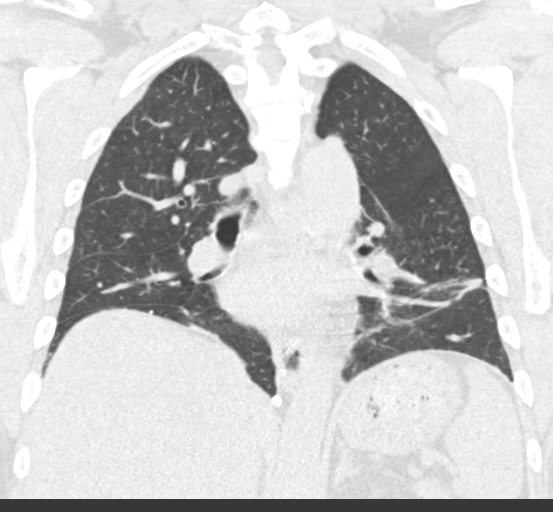

[15 of 36 positions shown; findings below may reference images not displayed]

FINDINGS: Cardiovascular: Normal heart size. No pericardial effusion. Coronary
artery calcifications. Normal caliber thoracic aorta with scattered
calcification.

Mediastinum/Nodes: Esophagus is decompressed. No significant
lymphadenopathy in the chest. Scattered mediastinal lymph nodes are
not pathologically enlarged.

Lungs/Pleura: Evaluation is limited due to motion artifact.
Atelectasis in the lung bases. Multiple noncalcified nodules are
demonstrated throughout both lungs. Some of the nodules have
somewhat irregular margins. Probably the largest nodule is in the
right apex measuring 6 mm in diameter. No pleural effusions. No
pneumothorax. Airways are patent.

Upper Abdomen: A large liver mass demonstrated on previous CT and MR
studies is not well appreciated on noncontrast imaging but there is
heterogeneity of the liver and small intraparenchymal gas likely
corresponding to the tumor. Lymphadenopathy is demonstrated in the
porta hepatis.

Musculoskeletal: No destructive bone lesions.
IMPRESSION: 1. Multiple bilateral pulmonary nodules measuring up to 6 mm in
diameter. Given the history of malignancy, metastasis is probable
although postinflammatory changes could also have this appearance.
Lesions are too small to be reliably characterize on PET-CT.
Consider three-month follow-up CT study.
2. Large liver mass better defined on previous CT and MR studies.
Metastatic lymph nodes in the upper abdomen.
3. Coronary artery calcifications.
